# Patient Record
Sex: Male | Born: 1950 | Race: Black or African American | Hispanic: No | Smoking: Current every day smoker
Health system: Southern US, Community
[De-identification: ages and names within clinical notes are randomized; demographics above are authoritative.]

## PROBLEM LIST (undated history)

## (undated) DIAGNOSIS — E119 Type 2 diabetes mellitus without complications: Secondary | ICD-10-CM

## (undated) DIAGNOSIS — N200 Calculus of kidney: Secondary | ICD-10-CM

## (undated) DIAGNOSIS — K5710 Diverticulosis of small intestine without perforation or abscess without bleeding: Secondary | ICD-10-CM

## (undated) DIAGNOSIS — M1990 Unspecified osteoarthritis, unspecified site: Secondary | ICD-10-CM

## (undated) DIAGNOSIS — I639 Cerebral infarction, unspecified: Secondary | ICD-10-CM

## (undated) DIAGNOSIS — N400 Enlarged prostate without lower urinary tract symptoms: Secondary | ICD-10-CM

## (undated) DIAGNOSIS — E739 Lactose intolerance, unspecified: Secondary | ICD-10-CM

## (undated) DIAGNOSIS — I10 Essential (primary) hypertension: Secondary | ICD-10-CM

## (undated) DIAGNOSIS — J189 Pneumonia, unspecified organism: Secondary | ICD-10-CM

## (undated) DIAGNOSIS — M545 Low back pain, unspecified: Secondary | ICD-10-CM

## (undated) DIAGNOSIS — B1920 Unspecified viral hepatitis C without hepatic coma: Secondary | ICD-10-CM

## (undated) DIAGNOSIS — G8929 Other chronic pain: Secondary | ICD-10-CM

## (undated) HISTORY — PX: COLONOSCOPY: SHX174

## (undated) HISTORY — PX: TESTICLE REMOVAL: SHX68

## (undated) HISTORY — PX: VARIOCOCELE REPAIR: SHX242

## (undated) HISTORY — PX: TEAR DUCT PROBING: SHX793

## (undated) HISTORY — PX: PENILE PROSTHESIS IMPLANT: SHX240

## (undated) HISTORY — PX: EYE SURGERY: SHX253

---

## 2015-10-22 ENCOUNTER — Encounter (HOSPITAL_COMMUNITY): Payer: Self-pay | Admitting: Radiology

## 2015-10-22 ENCOUNTER — Observation Stay (HOSPITAL_COMMUNITY)
Admission: EM | Admit: 2015-10-22 | Discharge: 2015-10-24 | Disposition: A | Discharge status: Home / Self Care | Payer: Non-veteran care | Attending: Internal Medicine | Admitting: Internal Medicine

## 2015-10-22 ENCOUNTER — Emergency Department (HOSPITAL_COMMUNITY): Payer: Non-veteran care

## 2015-10-22 ENCOUNTER — Observation Stay (HOSPITAL_COMMUNITY): Payer: Non-veteran care

## 2015-10-22 DIAGNOSIS — G8929 Other chronic pain: Secondary | ICD-10-CM | POA: Diagnosis not present

## 2015-10-22 DIAGNOSIS — G459 Transient cerebral ischemic attack, unspecified: Secondary | ICD-10-CM | POA: Diagnosis not present

## 2015-10-22 DIAGNOSIS — R29810 Facial weakness: Secondary | ICD-10-CM | POA: Diagnosis not present

## 2015-10-22 DIAGNOSIS — I63411 Cerebral infarction due to embolism of right middle cerebral artery: Secondary | ICD-10-CM

## 2015-10-22 DIAGNOSIS — N183 Chronic kidney disease, stage 3 (moderate): Secondary | ICD-10-CM | POA: Insufficient documentation

## 2015-10-22 DIAGNOSIS — F17210 Nicotine dependence, cigarettes, uncomplicated: Secondary | ICD-10-CM | POA: Diagnosis not present

## 2015-10-22 DIAGNOSIS — E11649 Type 2 diabetes mellitus with hypoglycemia without coma: Secondary | ICD-10-CM | POA: Diagnosis not present

## 2015-10-22 DIAGNOSIS — I10 Essential (primary) hypertension: Secondary | ICD-10-CM | POA: Diagnosis not present

## 2015-10-22 DIAGNOSIS — Z7982 Long term (current) use of aspirin: Secondary | ICD-10-CM | POA: Insufficient documentation

## 2015-10-22 DIAGNOSIS — E119 Type 2 diabetes mellitus without complications: Secondary | ICD-10-CM

## 2015-10-22 DIAGNOSIS — E1122 Type 2 diabetes mellitus with diabetic chronic kidney disease: Secondary | ICD-10-CM | POA: Diagnosis not present

## 2015-10-22 DIAGNOSIS — I63232 Cerebral infarction due to unspecified occlusion or stenosis of left carotid arteries: Secondary | ICD-10-CM

## 2015-10-22 DIAGNOSIS — I6320 Cerebral infarction due to unspecified occlusion or stenosis of unspecified precerebral arteries: Secondary | ICD-10-CM

## 2015-10-22 DIAGNOSIS — I129 Hypertensive chronic kidney disease with stage 1 through stage 4 chronic kidney disease, or unspecified chronic kidney disease: Secondary | ICD-10-CM | POA: Diagnosis not present

## 2015-10-22 DIAGNOSIS — I639 Cerebral infarction, unspecified: Secondary | ICD-10-CM | POA: Diagnosis not present

## 2015-10-22 DIAGNOSIS — R531 Weakness: Secondary | ICD-10-CM

## 2015-10-22 DIAGNOSIS — IMO0002 Reserved for concepts with insufficient information to code with codable children: Secondary | ICD-10-CM

## 2015-10-22 DIAGNOSIS — Z79899 Other long term (current) drug therapy: Secondary | ICD-10-CM | POA: Insufficient documentation

## 2015-10-22 DIAGNOSIS — I6523 Occlusion and stenosis of bilateral carotid arteries: Secondary | ICD-10-CM | POA: Insufficient documentation

## 2015-10-22 DIAGNOSIS — M545 Low back pain: Secondary | ICD-10-CM | POA: Insufficient documentation

## 2015-10-22 DIAGNOSIS — N289 Disorder of kidney and ureter, unspecified: Secondary | ICD-10-CM | POA: Diagnosis not present

## 2015-10-22 DIAGNOSIS — Z794 Long term (current) use of insulin: Secondary | ICD-10-CM | POA: Insufficient documentation

## 2015-10-22 DIAGNOSIS — I6329 Cerebral infarction due to unspecified occlusion or stenosis of other precerebral arteries: Secondary | ICD-10-CM

## 2015-10-22 DIAGNOSIS — I6359 Cerebral infarction due to unspecified occlusion or stenosis of other cerebral artery: Secondary | ICD-10-CM

## 2015-10-22 DIAGNOSIS — Z8673 Personal history of transient ischemic attack (TIA), and cerebral infarction without residual deficits: Secondary | ICD-10-CM | POA: Diagnosis not present

## 2015-10-22 DIAGNOSIS — F17200 Nicotine dependence, unspecified, uncomplicated: Secondary | ICD-10-CM | POA: Diagnosis not present

## 2015-10-22 HISTORY — DX: Unspecified viral hepatitis C without hepatic coma: B19.20

## 2015-10-22 HISTORY — DX: Essential (primary) hypertension: I10

## 2015-10-22 HISTORY — DX: Diverticulosis of small intestine without perforation or abscess without bleeding: K57.10

## 2015-10-22 HISTORY — DX: Low back pain, unspecified: M54.50

## 2015-10-22 HISTORY — DX: Other chronic pain: G89.29

## 2015-10-22 HISTORY — DX: Low back pain: M54.5

## 2015-10-22 HISTORY — DX: Type 2 diabetes mellitus without complications: E11.9

## 2015-10-22 HISTORY — DX: Cerebral infarction, unspecified: I63.9

## 2015-10-22 HISTORY — DX: Unspecified osteoarthritis, unspecified site: M19.90

## 2015-10-22 LAB — COMPREHENSIVE METABOLIC PANEL
ALBUMIN: 3.2 g/dL — AB (ref 3.5–5.0)
ALT: 10 U/L — ABNORMAL LOW (ref 17–63)
AST: 16 U/L (ref 15–41)
Alkaline Phosphatase: 81 U/L (ref 38–126)
Anion gap: 10 (ref 5–15)
BUN: 16 mg/dL (ref 6–20)
CHLORIDE: 106 mmol/L (ref 101–111)
CO2: 21 mmol/L — ABNORMAL LOW (ref 22–32)
Calcium: 8.9 mg/dL (ref 8.9–10.3)
Creatinine, Ser: 1.51 mg/dL — ABNORMAL HIGH (ref 0.61–1.24)
GFR calc Af Amer: 55 mL/min — ABNORMAL LOW (ref >60)
GFR calc non Af Amer: 47 mL/min — ABNORMAL LOW (ref >60)
GLUCOSE: 75 mg/dL (ref 65–99)
POTASSIUM: 3.7 mmol/L (ref 3.5–5.1)
SODIUM: 137 mmol/L (ref 135–145)
Total Bilirubin: 0.5 mg/dL (ref 0.3–1.2)
Total Protein: 7.2 g/dL (ref 6.5–8.1)

## 2015-10-22 LAB — URINALYSIS, ROUTINE W REFLEX MICROSCOPIC
Bilirubin Urine: NEGATIVE
GLUCOSE, UA: NEGATIVE mg/dL
Ketones, ur: NEGATIVE mg/dL
Leukocytes, UA: NEGATIVE
Nitrite: NEGATIVE
Specific Gravity, Urine: 1.022 (ref 1.005–1.030)
pH: 5.5 (ref 5.0–8.0)

## 2015-10-22 LAB — I-STAT CHEM 8, ED
BUN: 18 mg/dL (ref 6–20)
CREATININE: 1.5 mg/dL — AB (ref 0.61–1.24)
Calcium, Ion: 1.08 mmol/L — ABNORMAL LOW (ref 1.13–1.30)
Chloride: 105 mmol/L (ref 101–111)
GLUCOSE: 71 mg/dL (ref 65–99)
HCT: 46 % (ref 39.0–52.0)
HEMOGLOBIN: 15.6 g/dL (ref 13.0–17.0)
POTASSIUM: 3.6 mmol/L (ref 3.5–5.1)
Sodium: 139 mmol/L (ref 135–145)
TCO2: 21 mmol/L (ref 0–100)

## 2015-10-22 LAB — CBC
HCT: 41.1 % (ref 39.0–52.0)
HEMOGLOBIN: 13.8 g/dL (ref 13.0–17.0)
MCH: 26.5 pg (ref 26.0–34.0)
MCHC: 33.6 g/dL (ref 30.0–36.0)
MCV: 78.9 fL (ref 78.0–100.0)
Platelets: 218 10*3/uL (ref 150–400)
RBC: 5.21 MIL/uL (ref 4.22–5.81)
RDW: 14.5 % (ref 11.5–15.5)
WBC: 14.1 10*3/uL — ABNORMAL HIGH (ref 4.0–10.5)

## 2015-10-22 LAB — DIFFERENTIAL
BASOS ABS: 0 10*3/uL (ref 0.0–0.1)
BASOS PCT: 0 %
EOS ABS: 0 10*3/uL (ref 0.0–0.7)
Eosinophils Relative: 0 %
Lymphocytes Relative: 18 %
Lymphs Abs: 2.6 10*3/uL (ref 0.7–4.0)
Monocytes Absolute: 1.4 10*3/uL — ABNORMAL HIGH (ref 0.1–1.0)
Monocytes Relative: 10 %
NEUTROS PCT: 72 %
Neutro Abs: 10.1 10*3/uL — ABNORMAL HIGH (ref 1.7–7.7)

## 2015-10-22 LAB — ETHANOL

## 2015-10-22 LAB — GLUCOSE, CAPILLARY
GLUCOSE-CAPILLARY: 78 mg/dL (ref 65–99)
GLUCOSE-CAPILLARY: 53 mg/dL — AB (ref 65–99)
GLUCOSE-CAPILLARY: 147 mg/dL — AB (ref 65–99)
Glucose-Capillary: 52 mg/dL — ABNORMAL LOW (ref 65–99)
Glucose-Capillary: 84 mg/dL (ref 65–99)

## 2015-10-22 LAB — RAPID URINE DRUG SCREEN, HOSP PERFORMED
Amphetamines: NOT DETECTED
BARBITURATES: NOT DETECTED
BENZODIAZEPINES: NOT DETECTED
COCAINE: NOT DETECTED
Opiates: NOT DETECTED
TETRAHYDROCANNABINOL: NOT DETECTED

## 2015-10-22 LAB — URINE MICROSCOPIC-ADD ON

## 2015-10-22 LAB — PROTIME-INR
INR: 1.09 (ref 0.00–1.49)
Prothrombin Time: 14.3 seconds (ref 11.6–15.2)

## 2015-10-22 LAB — I-STAT TROPONIN, ED: TROPONIN I, POC: 0.01 ng/mL (ref 0.00–0.08)

## 2015-10-22 LAB — CBG MONITORING, ED: Glucose-Capillary: 71 mg/dL (ref 65–99)

## 2015-10-22 LAB — APTT: APTT: 35 s (ref 24–37)

## 2015-10-22 MED ORDER — INSULIN GLARGINE 100 UNIT/ML ~~LOC~~ SOLN
20.0000 [IU] | Freq: Every day | SUBCUTANEOUS | Status: DC
  Administered 2015-10-22 – 2015-10-23 (×2): 20 [IU] via SUBCUTANEOUS
  Filled 2015-10-22 (×3): qty 0.2

## 2015-10-22 MED ORDER — DEXTROSE 50 % IV SOLN
INTRAVENOUS | Status: AC
  Filled 2015-10-22: qty 50

## 2015-10-22 MED ORDER — CLOPIDOGREL BISULFATE 75 MG PO TABS
75.0000 mg | ORAL_TABLET | Freq: Every day | ORAL | Status: DC
  Administered 2015-10-22 – 2015-10-24 (×3): 75 mg via ORAL
  Filled 2015-10-22 (×3): qty 1

## 2015-10-22 MED ORDER — INSULIN ASPART 100 UNIT/ML ~~LOC~~ SOLN: 0.0000 [IU] | Freq: Every day | SUBCUTANEOUS | Status: DC

## 2015-10-22 MED ORDER — LABETALOL HCL 5 MG/ML IV SOLN: 5.0000 mg | INTRAVENOUS | Status: DC | PRN

## 2015-10-22 MED ORDER — HEPARIN SOD (PORK) LOCK FLUSH 100 UNIT/ML IV SOLN
INTRAVENOUS | Status: AC
  Filled 2015-10-22: qty 20

## 2015-10-22 MED ORDER — DEXTROSE-NACL 5-0.9 % IV SOLN
INTRAVENOUS | Status: DC
  Administered 2015-10-22: 20:00:00 via INTRAVENOUS
  Filled 2015-10-22 (×3): qty 1000

## 2015-10-22 MED ORDER — MIDAZOLAM HCL 2 MG/2ML IJ SOLN
INTRAMUSCULAR | Status: AC | PRN
  Administered 2015-10-22: 1 mg via INTRAVENOUS

## 2015-10-22 MED ORDER — HYDRALAZINE HCL 20 MG/ML IJ SOLN
INTRAMUSCULAR | Status: AC
  Filled 2015-10-22: qty 1

## 2015-10-22 MED ORDER — DEXTROSE 50 % IV SOLN
INTRAVENOUS | Status: AC | PRN
  Administered 2015-10-22: 12.5 g via INTRAVENOUS

## 2015-10-22 MED ORDER — HEPARIN SODIUM (PORCINE) 1000 UNIT/ML IJ SOLN
INTRAMUSCULAR | Status: AC | PRN
  Administered 2015-10-22: 1000 [IU] via INTRAVENOUS

## 2015-10-22 MED ORDER — HYDRALAZINE HCL 20 MG/ML IJ SOLN
5.0000 mg | Freq: Once | INTRAMUSCULAR | Status: AC
  Administered 2015-10-22: 5 mg via INTRAVENOUS

## 2015-10-22 MED ORDER — SODIUM CHLORIDE 0.9 % IV SOLN: INTRAVENOUS | Status: DC

## 2015-10-22 MED ORDER — IOPAMIDOL (ISOVUE-300) INJECTION 61%
INTRAVENOUS | Status: AC
  Administered 2015-10-22: 20 mL
  Filled 2015-10-22: qty 50

## 2015-10-22 MED ORDER — IOPAMIDOL (ISOVUE-370) INJECTION 76%
INTRAVENOUS | Status: AC
  Administered 2015-10-22: 75 mL
  Filled 2015-10-22: qty 100

## 2015-10-22 MED ORDER — SODIUM CHLORIDE 0.9% FLUSH
3.0000 mL | Freq: Two times a day (BID) | INTRAVENOUS | Status: DC
  Administered 2015-10-22 – 2015-10-24 (×3): 3 mL via INTRAVENOUS

## 2015-10-22 MED ORDER — ENOXAPARIN SODIUM 40 MG/0.4ML ~~LOC~~ SOLN
40.0000 mg | SUBCUTANEOUS | Status: DC
  Administered 2015-10-22 – 2015-10-23 (×2): 40 mg via SUBCUTANEOUS
  Filled 2015-10-22 (×2): qty 0.4

## 2015-10-22 MED ORDER — LIDOCAINE HCL 1 % IJ SOLN
INTRAMUSCULAR | Status: AC
  Administered 2015-10-22: 8 mL
  Filled 2015-10-22: qty 20

## 2015-10-22 MED ORDER — INSULIN ASPART 100 UNIT/ML ~~LOC~~ SOLN
0.0000 [IU] | Freq: Three times a day (TID) | SUBCUTANEOUS | Status: DC
  Administered 2015-10-23: 2 [IU] via SUBCUTANEOUS

## 2015-10-22 MED ORDER — FENTANYL CITRATE (PF) 100 MCG/2ML IJ SOLN
INTRAMUSCULAR | Status: AC
  Filled 2015-10-22: qty 2

## 2015-10-22 MED ORDER — MIDAZOLAM HCL 2 MG/2ML IJ SOLN
INTRAMUSCULAR | Status: AC
  Filled 2015-10-22: qty 2

## 2015-10-22 MED ORDER — ASPIRIN 325 MG PO TABS
325.0000 mg | ORAL_TABLET | Freq: Every day | ORAL | Status: DC
  Administered 2015-10-23: 325 mg via ORAL
  Filled 2015-10-22: qty 1

## 2015-10-22 MED ORDER — SODIUM CHLORIDE 0.9 % IV SOLN
INTRAVENOUS | Status: AC | PRN
  Administered 2015-10-22: 250 mL via INTRAVENOUS

## 2015-10-22 MED ORDER — IOPAMIDOL (ISOVUE-300) INJECTION 61%
INTRAVENOUS | Status: AC
  Administered 2015-10-22: 80 mL via INTRA_ARTERIAL
  Filled 2015-10-22: qty 150

## 2015-10-22 MED ORDER — FENTANYL CITRATE (PF) 100 MCG/2ML IJ SOLN
INTRAMUSCULAR | Status: AC | PRN
  Administered 2015-10-22 (×2): 25 ug via INTRAVENOUS

## 2015-10-22 NOTE — ED Provider Notes (Signed)
CSN: [id number]     Arrival date & time 10/22/15  0901 History   First MD Initiated Contact with Patient 10/22/15 [phone]     Chief Complaint  Patient presents with  . Code Stroke    An emergency department physician performed an initial assessment on this suspected stroke patient at [phone]. (Consider location/radiation/quality/duration/timing/severity/associated sxs/prior Treatment) HPI   Lance Duncan is a 65 year old male with a past medical history of HTN, DM, chronic back pain who presents the ED with acute onset left-sided weakness. Patient states that he was sitting up in his chair around 4 AM watching television and noticed that his left arm began to feel numb and tingling. He was then unable to reach over to grab his remote as normal and states that he couldn't get his arm to move. Pt was also unable to walk as his left leg felt very weak. No associated slurred speech or facial drooping. Pt denies HA, blurry vision, confusion. Pt does not take blood thinners.   No past medical history on file. No past surgical history on file. No family history on file. Social History  Substance Use Topics  . Smoking status: Not on file  . Smokeless tobacco: Not on file  . Alcohol Use: Not on file    Review of Systems  All other systems reviewed and are negative.     Allergies  Review of patient's allergies indicates not on file.  Home Medications   Prior to Admission medications   Not on File   SpO2 99% Physical Exam  Constitutional: He is oriented to person, place, and time. He appears well-developed and well-nourished. No distress.  HENT:  Head: Normocephalic and atraumatic.  Mouth/Throat: No oropharyngeal exudate.  Eyes: Conjunctivae and EOM are normal. Pupils are equal, round, and reactive to light. Right eye exhibits no discharge. Left eye exhibits no discharge. No scleral icterus.  Cardiovascular: Normal rate, regular rhythm, normal heart sounds and intact distal pulses.   Exam reveals no gallop and no friction rub.   No murmur heard. Pulmonary/Chest: Effort normal and breath sounds normal. No respiratory distress. He has no wheezes. He has no rales. He exhibits no tenderness.  Abdominal: Soft. He exhibits no distension. There is no tenderness. There is no guarding.  Musculoskeletal: Normal range of motion. He exhibits no edema.  Neurological: He is alert and oriented to person, place, and time. Coordination and gait abnormal.  LLE strength 3/5. LUE strength 4/5. Significant gait ataxia. Dysmetria present. Unable to perform finger to nose with left hand. No sensory deficits. RUE and RLE strength 5/5.  No facial droop. No slurred speech. Left pronator drift present.  Skin: Skin is warm and dry. No rash noted. He is not diaphoretic. No erythema. No pallor.  Psychiatric: He has a normal mood and affect. His behavior is normal.  Nursing note and vitals reviewed.   ED Course  Procedures (including critical care time) Labs Review Labs Reviewed  CBC - Abnormal; Notable for the following:    WBC 14.1 (*)    All other components within normal limits  DIFFERENTIAL - Abnormal; Notable for the following:    Neutro Abs 10.1 (*)    Monocytes Absolute 1.4 (*)    All other components within normal limits  COMPREHENSIVE METABOLIC PANEL - Abnormal; Notable for the following:    CO2 21 (*)    Creatinine, Ser 1.51 (*)    Albumin 3.2 (*)    ALT 10 (*)    GFR calc non Af  Amer 47 (*)    GFR calc Af Amer 55 (*)    All other components within normal limits  I-STAT CHEM 8, ED - Abnormal; Notable for the following:    Creatinine, Ser 1.50 (*)    Calcium, Ion 1.08 (*)    All other components within normal limits  ETHANOL  PROTIME-INR  APTT  URINE RAPID DRUG SCREEN, HOSP PERFORMED  URINALYSIS, ROUTINE W REFLEX MICROSCOPIC (NOT AT [facility])  I-STAT TROPOININ, ED  CBG MONITORING, ED    Imaging Review Ct Angio Head W/cm &/or Wo Cm  10/22/2015  EXAM: CT ANGIOGRAPHY HEAD AND  NECK TECHNIQUE: Multidetector CT imaging of the head and neck was performed using the standard protocol during bolus administration of intravenous contrast. Multiplanar CT image reconstructions and MIPs were obtained to evaluate the vascular anatomy. Carotid stenosis measurements (when applicable) are obtained utilizing NASCET criteria, using the distal internal carotid diameter as the denominator. COMPARISON:  None. FINDINGS: CT HEAD Brain: No intracranial hemorrhage or CT evidence of large acute infarct. Small vessel disease changes. Limited for excluding small acute infarct given white matter changes. MR can be obtained for further delineation if clinically desired. No worrisome intracranial enhancing lesion. Global atrophy without hydrocephalus. Calvarium and skull base: Arachnoid granulations without destructive lesion. Paranasal sinuses: Dental disease with periapical lucency upper teeth without adjacent sinus disease. Mastoid air cells and middle ear cavities are clear. Orbits: Minimal exophthalmos. CTA NECK Aortic arch: Common origin innominate and left common carotid artery. Plaque aortic arch and origin of great vessels with mild slightly moderate narrowing proximal left subclavian artery. Right carotid system: Calcified plaque carotid bifurcation with 48% diameter stenosis distal right common carotid artery. Calcified plaque proximal right internal carotid artery with less than 50% diameter stenosis. 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 This is flow limiting as the left internal carotid artery beyond this region is narrowed. Vertebral arteries:Left vertebral artery is slightly dominant size. Mild irregularity of the cervical segment of the vertebral arteries bilaterally. Skeleton: Dental disease. 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 Other neck: Goiter with  substernal extension on the left. Mild asymmetry palatine tonsils/ piriform sinus without mass identified. Upper chest: Chronic lung changes possibly with superimposed pulmonary vascular prominence. CTA HEAD Anterior circulation: No medium or large size vessel significant stenosis or occlusion. Middle cerebral artery branch vessel narrowing irregularity bilaterally. Plaque internal carotid artery cavernous segment without high-grade stenosis. There may be 2 tiny (1 mm) aneurysm of the right internal carotid artery cavernous segment. Fetal type contribution to the posterior cerebral arteries. Posterior circulation: Narrowed irregular distal vertebral arteries and basilar artery. Irregular narrowed posterior inferior cerebellar arteries, anterior inferior cerebral arteries and superior cerebellar arteries. Venous sinuses: Patent. Anatomic variants: As above. Delayed phase: As above. IMPRESSION: CT HEAD No intracranial hemorrhage or CT evidence of large acute infarct. Small vessel disease changes. Global atrophy without hydrocephalus. CTA NECK Mild to slightly moderate narrowing proximal left subclavian artery. Calcified plaque right carotid bifurcation with 48% diameter stenosis distal right common carotid artery. Calcified plaque proximal right internal carotid artery with less than 50% diameter stenosis. 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 This is flow limiting as the left internal carotid artery beyond this region is narrowed. Left vertebral artery is slightly dominant size. Mild irregularity of the cervical segment of the vertebral arteries bilaterally. Dental  disease. 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 Chronic lung changes possibly with superimposed pulmonary vascular prominence. CTA HEAD Anterior circulation without medium or large size vessel  significant stenosis or occlusion. Middle cerebral artery branch vessel narrowing and irregularity bilaterally. Plaque internal carotid artery cavernous segment without high-grade stenosis. There may be 2 tiny (1 mm) aneurysms of the right internal carotid artery cavernous segment. Fetal type contribution to the posterior cerebral arteries. Irregular narrowed distal vertebral arteries and basilar artery. Irregular narrowed posterior inferior cerebellar arteries, anterior inferior cerebral arteries and superior cerebellar arteries. These results were called by telephone at the time of interpretation on 10/22/2015 at 9:42 am to Dr. Lavon Paganini who verbally acknowledged these results. Electronically Signed   By: Lacy Duverney M.D.   On: 10/22/2015 10:22   Ct Head Wo Contrast  10/22/2015  CLINICAL DATA:  Code stroke.  New onset left-sided weakness. EXAM: CT HEAD WITHOUT CONTRAST TECHNIQUE: Contiguous axial images were obtained from the base of the skull through the vertex without intravenous contrast. COMPARISON:  None. FINDINGS: No acute infarct, hemorrhage, or mass lesion is present. Mild generalized atrophy and scattered subcortical white matter hypoattenuation is advanced for age. The cortex is intact. The basal ganglia are intact. The insular ribbon is within normal limits. The brainstem and cerebellum are unremarkable. No significant extra-axial fluid collection is present. Atherosclerotic changes are present within the cavernous internal carotid arteries bilaterally. The paranasal sinuses and mastoid air cells are clear. The calvarium is intact. No significant extracranial soft tissue lesions are present. The globes and orbits are intact. ASPECTS score = 10/10 [place] Stroke Program Early CT Score Normal score = 10 IMPRESSION: 1. No acute intracranial abnormality. 2. Mild generalized atrophy and white matter disease is slightly advanced for age. This likely reflects the sequela of chronic microvascular ischemia.  These results were called by telephone at the time of interpretation on 10/22/2015 at 9:22 am to Dr. Lavon Paganini , who verbally acknowledged these results. Electronically Signed   By: Marin Roberts M.D.   On: 10/22/2015 09:24   Ct Angio Neck W/cm &/or Wo/cm  10/22/2015  EXAM: CT ANGIOGRAPHY HEAD AND NECK TECHNIQUE: Multidetector CT imaging of the head and neck was performed using the standard protocol during bolus administration of intravenous contrast. Multiplanar CT image reconstructions and MIPs were obtained to evaluate the vascular anatomy. Carotid stenosis measurements (when applicable) are obtained utilizing NASCET criteria, using the distal internal carotid diameter as the denominator. COMPARISON:  None. FINDINGS: CT HEAD Brain: No intracranial hemorrhage or CT evidence of large acute infarct. Small vessel disease changes. Limited for excluding small acute infarct given white matter changes. MR can be obtained for further delineation if clinically desired. No worrisome intracranial enhancing lesion. Global atrophy without hydrocephalus. Calvarium and skull base: Arachnoid granulations without destructive lesion. Paranasal sinuses: Dental disease with periapical lucency upper teeth without adjacent sinus disease. Mastoid air cells and middle ear cavities are clear. Orbits: Minimal exophthalmos. CTA NECK Aortic arch: Common origin innominate and left common carotid artery. Plaque aortic arch and origin of great vessels with mild slightly moderate narrowing proximal left subclavian artery. Right carotid system: Calcified plaque carotid bifurcation with 48% diameter stenosis distal right common carotid artery. Calcified plaque proximal right internal carotid artery with less than 50% diameter stenosis. 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 This is flow limiting as the left  internal carotid artery  beyond this region is narrowed. Vertebral arteries:Left vertebral artery is slightly dominant size. Mild irregularity of the cervical segment of the vertebral arteries bilaterally. Skeleton: Dental disease. 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 Mild asymmetry palatine tonsils/ piriform sinus without mass identified. Upper chest: Chronic lung changes possibly with superimposed pulmonary vascular prominence. CTA HEAD Anterior circulation: No medium or large size vessel significant stenosis or occlusion. Middle cerebral artery branch vessel narrowing irregularity bilaterally. Plaque internal carotid artery cavernous segment without high-grade stenosis. There may be 2 tiny (1 mm) aneurysm of the right internal carotid artery cavernous segment. Fetal type contribution to the posterior cerebral arteries. Posterior circulation: Narrowed irregular distal vertebral arteries and basilar artery. Irregular narrowed posterior inferior cerebellar arteries, anterior inferior cerebral arteries and superior cerebellar arteries. Venous sinuses: Patent. Anatomic variants: As above. Delayed phase: As above. IMPRESSION: CT HEAD No intracranial hemorrhage or CT evidence of large acute infarct. Small vessel disease changes. Global atrophy without hydrocephalus. CTA NECK Mild to slightly moderate narrowing proximal left subclavian artery. Calcified plaque right carotid bifurcation with 48% diameter stenosis distal right common carotid artery. Calcified plaque proximal right internal carotid artery with less than 50% diameter stenosis. 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 This is flow limiting as the left internal carotid artery beyond this region is narrowed. Left vertebral artery is slightly dominant size. Mild irregularity of the cervical segment of the vertebral  arteries bilaterally. Dental disease. 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 Chronic lung changes possibly with superimposed pulmonary vascular prominence. CTA HEAD Anterior circulation without medium or large size vessel significant stenosis or occlusion. Middle cerebral artery branch vessel narrowing and irregularity bilaterally. Plaque internal carotid artery cavernous segment without high-grade stenosis. There may be 2 tiny (1 mm) aneurysms of the right internal carotid artery cavernous segment. Fetal type contribution to the posterior cerebral arteries. Irregular narrowed distal vertebral arteries and basilar artery. Irregular narrowed posterior inferior cerebellar arteries, anterior inferior cerebral arteries and superior cerebellar arteries. These results were called by telephone at the time of interpretation on 10/22/2015 at 9:42 am to Dr. Lavon PaganiniNandigam who verbally acknowledged these results. Electronically Signed   By: Lacy DuverneySteven  Olson M.D.   On: 10/22/2015 10:22   I have personally reviewed and evaluated these images and lab results as part of my medical decision-making.   EKG Interpretation None      MDM   Final diagnoses:  Weakness due to cerebrovascular accident    65 y.o m presents with new onset left sided weakness. Last known normal was 5 hours PTA. Left sided neurological deficits present on exam. Code stroke was called. CT head negative for acute infarct or bleed. 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 Neurology, Dr. Lavon PaganiniNandigam at bedside. Plan to admit for stroke workup. No tPA indicated at this time. Creatinine elevated at 1.5. Unknown baseline. Pt is currently hemodynamically stable and awaiting bed placement. Spoke with hospitalist who will accept pt to their service.   Patient was  discussed with and seen by Dr. Fayrene FearingJames who agrees with the treatment plan.      Lester KinsmanSamantha Tripp [place], PA-C 10/22/15 1045  Rolland PorterMark James, MD 10/30/15 [phone]

## 2015-10-22 NOTE — Progress Notes (Signed)
RN CINDY NOTIFIED THAT PT STATED THAT HE HAD A PENILE IMPLANT AND THAT PT HAS NO INFORMATION ON IT, NEED INFO ON IMPLANT IN ORDER TO GET SCAN DONE, CLARIFIED WITH TECH CHARLA

## 2015-10-22 NOTE — ED Provider Notes (Signed)
Pt seen by myself on arrival just inside our ambulance bay on arrival at 9:15.  He presents with complaint of numbness or weakness of the left upper, and lower extremity. Last time known normal was 3-4am AM this morning. He was up with insomnia due to chronic back pain. He remembers being able to use his left upper extremity or his water, and his remote control. These are sitting at his left chair side table. By 5 AM he felt like the arm was numb and it became weak and difficult to use.  On exam here he does not have facial deficits. He has 4 out of 5 weakness and dysmetria with the left upper extremity, 4 out of 5 weakness left lower extremity area initial CT, CTA showed no acute abnormalities. Patient will require admission for further stroke workup. He is in a sinus rhythm, hemolytically stable, normal blood sugar.  Rolland PorterMark Idy Rawling, MD 10/22/15 1021

## 2015-10-22 NOTE — Progress Notes (Signed)
Patient had a bloodsugar of 44 patient was given 4oz of orange juice was given with sugar

## 2015-10-22 NOTE — Sedation Documentation (Signed)
Patient is resting comfortably. 

## 2015-10-22 NOTE — Procedures (Signed)
S/P 4 vessel cerebral arteriogram. RT CFa approach. Findings. 1.95 % plus pre occlusive stenosis of LT ICA prox. 2.Non opacification of LT ACA prox. 3.Approx 50 % stenosis of Rt ICA petrous  Cavernous  segment. 4.approx 50 % stenosis of LT VA  prox

## 2015-10-22 NOTE — Consult Note (Signed)
Chief Complaint: CVA  Referring Physician: Dr. Alita Chyle  Supervising Physician: Luanne Bras  HPI: Lance Duncan is an 65 y.o. male who woke up this morning around 3 or 4 am due to chronic back pain.  The next time he was awake he was having some LUE and LLE weakness.  He denies blurred vision or slurred speech.  He presented to the [facility] where he had a CTA of the head and neck that showed some small vessel disease along with some narrowing and stenosis.  We have been asked to see the patient for a cerebral angiogram to further delineate these findings.  Past Medical History:  Past Medical History  Diagnosis Date  . Hypertension   . Diverticula of small intestine   . Stroke [facility])     Past Surgical History:  Past Surgical History  Procedure Laterality Date  . Penile prosthesis implant      10/22/15 PT STATED WHILE IN MRI  . Abdominal surgery      Family History: History reviewed. No pertinent family history.  Social History:  reports that he has been smoking Cigarettes.  He does not have any smokeless tobacco history on file. He reports that he drinks alcohol. His drug history is not on file.  Allergies: No Known Allergies  Medications:   Medication List    ASK your doctor about these medications        amLODipine 10 MG tablet  Commonly known as:  NORVASC  Take 10 mg by mouth daily.     dorzolamide-timolol 22.3-6.8 MG/ML ophthalmic solution  Commonly known as:  COSOPT  Place 1 drop into both eyes 2 (two) times daily.     hydrochlorothiazide 25 MG tablet  Commonly known as:  HYDRODIURIL  Take 25 mg by mouth daily.     insulin glargine 100 unit/mL Sopn  Commonly known as:  LANTUS  Inject 66-74 Units into the skin at bedtime. Take 66 units before the first meal, and 74 units at bedtime     latanoprost 0.005 % ophthalmic solution  Commonly known as:  XALATAN  Place 1 drop into both eyes at bedtime.     lisinopril 40 MG tablet  Commonly known as:   PRINIVIL,ZESTRIL  Take 40 mg by mouth daily.     traMADol 50 MG tablet  Commonly known as:  ULTRAM  Take 50-100 mg by mouth every 6 (six) hours as needed for severe pain.        Please HPI for pertinent positives, otherwise complete 10 system ROS negative.  Mallampati Score: MD Evaluation Airway: WNL Heart: WNL Abdomen: WNL Chest/ Lungs: WNL ASA  Classification: 3 Mallampati/Airway Score: Two  Physical Exam: BP 188/100 mmHg  Pulse 58  Temp(Src) 98.7 F (37.1 C)  Resp 22  SpO2 98% There is no height or weight on file to calculate BMI. General: pleasant, WD, WN black male who is laying in bed in NAD HEENT: head is normocephalic, atraumatic.  Sclera are noninjected.  PERRL.  Ears and nose without any masses or lesions.  Mouth is pink and moist Heart: regular, rate, and rhythm.  Normal s1,s2. No obvious murmurs, gallops, or rubs noted.  Palpable radial and pedal pulses bilaterally Lungs: CTAB, no wheezes, rhonchi, or rales noted.  Respiratory effort nonlabored Abd: soft, NT, ND, +BS, no masses, hernias, or organomegaly Neuro: cranial nerves are Grossly intact.  Equal strength in upper and lower extremities bilaterally.  Normal sensation throughout.   Psych: A&Ox3 with an appropriate  affect.   Labs: Results for orders placed or performed during the hospital encounter of 10/22/15 (from the past 48 hour(s))  CBG monitoring, ED     Status: None   Collection Time: 10/22/15  9:04 AM  Result Value Ref Range   Glucose-Capillary 71 65 - 99 mg/dL  Protime-INR     Status: None   Collection Time: 10/22/15  9:11 AM  Result Value Ref Range   Prothrombin Time 14.3 11.6 - 15.2 seconds   INR 1.09 0.00 - 1.49  APTT     Status: None   Collection Time: 10/22/15  9:11 AM  Result Value Ref Range   aPTT 35 24 - 37 seconds  CBC     Status: Abnormal   Collection Time: 10/22/15  9:11 AM  Result Value Ref Range   WBC 14.1 (H) 4.0 - 10.5 K/uL   RBC 5.21 4.22 - 5.81 MIL/uL   Hemoglobin 13.8  13.0 - 17.0 g/dL   HCT 41.1 39.0 - 52.0 %   MCV 78.9 78.0 - 100.0 fL   MCH 26.5 26.0 - 34.0 pg   MCHC 33.6 30.0 - 36.0 g/dL   RDW 14.5 11.5 - 15.5 %   Platelets 218 150 - 400 K/uL  Differential     Status: Abnormal   Collection Time: 10/22/15  9:11 AM  Result Value Ref Range   Neutrophils Relative % 72 %   Neutro Abs 10.1 (H) 1.7 - 7.7 K/uL   Lymphocytes Relative 18 %   Lymphs Abs 2.6 0.7 - 4.0 K/uL   Monocytes Relative 10 %   Monocytes Absolute 1.4 (H) 0.1 - 1.0 K/uL   Eosinophils Relative 0 %   Eosinophils Absolute 0.0 0.0 - 0.7 K/uL   Basophils Relative 0 %   Basophils Absolute 0.0 0.0 - 0.1 K/uL  Comprehensive metabolic panel     Status: Abnormal   Collection Time: 10/22/15  9:11 AM  Result Value Ref Range   Sodium 137 135 - 145 mmol/L   Potassium 3.7 3.5 - 5.1 mmol/L   Chloride 106 101 - 111 mmol/L   CO2 21 (L) 22 - 32 mmol/L   Glucose, Bld 75 65 - 99 mg/dL   BUN 16 6 - 20 mg/dL   Creatinine, Ser 1.51 (H) 0.61 - 1.24 mg/dL   Calcium 8.9 8.9 - 10.3 mg/dL   Total Protein 7.2 6.5 - 8.1 g/dL   Albumin 3.2 (L) 3.5 - 5.0 g/dL   AST 16 15 - 41 U/L   ALT 10 (L) 17 - 63 U/L   Alkaline Phosphatase 81 38 - 126 U/L   Total Bilirubin 0.5 0.3 - 1.2 mg/dL   GFR calc non Af Amer 47 (L) >60 mL/min   GFR calc Af Amer 55 (L) >60 mL/min    Comment: (NOTE) The eGFR has been calculated using the CKD EPI equation. This calculation has not been validated in all clinical situations. eGFR's persistently <60 mL/min signify possible Chronic Kidney Disease.    Anion gap 10 5 - 15  I-stat troponin, ED (not at [facility])     Status: None   Collection Time: 10/22/15  9:11 AM  Result Value Ref Range   Troponin i, poc 0.01 0.00 - 0.08 ng/mL   Comment 3            Comment: Due to the release kinetics of cTnI, a negative result within the first hours of the onset of symptoms does not rule out myocardial infarction with certainty.  If myocardial infarction is still suspected, repeat the test  at appropriate intervals.   Ethanol     Status: None   Collection Time: 10/22/15  9:12 AM  Result Value Ref Range   Alcohol, Ethyl (B) <5 <5 mg/dL    Comment:        LOWEST DETECTABLE LIMIT FOR SERUM ALCOHOL IS 5 mg/dL FOR MEDICAL PURPOSES ONLY REPEATED TO VERIFY   I-Stat Chem 8, ED  (not at [facility])     Status: Abnormal   Collection Time: 10/22/15  9:13 AM  Result Value Ref Range   Sodium 139 135 - 145 mmol/L   Potassium 3.6 3.5 - 5.1 mmol/L   Chloride 105 101 - 111 mmol/L   BUN 18 6 - 20 mg/dL   Creatinine, Ser 1.50 (H) 0.61 - 1.24 mg/dL   Glucose, Bld 71 65 - 99 mg/dL   Calcium, Ion 1.08 (L) 1.13 - 1.30 mmol/L   TCO2 21 0 - 100 mmol/L   Hemoglobin 15.6 13.0 - 17.0 g/dL   HCT 46.0 39.0 - 52.0 %  Urinalysis, Routine w reflex microscopic (not at [facility])     Status: Abnormal   Collection Time: 10/22/15 10:40 AM  Result Value Ref Range   Color, Urine YELLOW YELLOW   APPearance CLEAR CLEAR   Specific Gravity, Urine 1.022 1.005 - 1.030   pH 5.5 5.0 - 8.0   Glucose, UA NEGATIVE NEGATIVE mg/dL   Hgb urine dipstick MODERATE (A) NEGATIVE   Bilirubin Urine NEGATIVE NEGATIVE   Ketones, ur NEGATIVE NEGATIVE mg/dL   Protein, ur >300 (A) NEGATIVE mg/dL   Nitrite NEGATIVE NEGATIVE   Leukocytes, UA NEGATIVE NEGATIVE  Urine microscopic-add on     Status: Abnormal   Collection Time: 10/22/15 10:40 AM  Result Value Ref Range   Squamous Epithelial / LPF 0-5 (A) NONE SEEN   WBC, UA 0-5 0 - 5 WBC/hpf   RBC / HPF 0-5 0 - 5 RBC/hpf   Bacteria, UA RARE (A) NONE SEEN  Urine rapid drug screen (hosp performed)not at [facility]     Status: None   Collection Time: 10/22/15 11:00 AM  Result Value Ref Range   Opiates NONE DETECTED NONE DETECTED   Cocaine NONE DETECTED NONE DETECTED   Benzodiazepines NONE DETECTED NONE DETECTED   Amphetamines NONE DETECTED NONE DETECTED   Tetrahydrocannabinol NONE DETECTED NONE DETECTED   Barbiturates NONE DETECTED NONE DETECTED    Comment:        DRUG SCREEN FOR  MEDICAL PURPOSES ONLY.  IF CONFIRMATION IS NEEDED FOR ANY PURPOSE, NOTIFY LAB WITHIN 5 DAYS.        LOWEST DETECTABLE LIMITS FOR URINE DRUG SCREEN Drug Class       Cutoff (ng/mL) Amphetamine      1000 Barbiturate      200 Benzodiazepine   330 Tricyclics       076 Opiates          300 Cocaine          300 THC              50     Imaging: Ct Angio Head W/cm &/or Wo Cm  10/22/2015  EXAM: CT ANGIOGRAPHY HEAD AND NECK TECHNIQUE: Multidetector CT imaging of the head and neck was performed using the standard protocol during bolus administration of intravenous contrast. Multiplanar CT image reconstructions and MIPs were obtained to evaluate the vascular anatomy. Carotid stenosis measurements (when applicable) are obtained utilizing NASCET criteria, using the distal  internal carotid diameter as the denominator. COMPARISON:  None. FINDINGS: CT HEAD Brain: No intracranial hemorrhage or CT evidence of large acute infarct. Small vessel disease changes. Limited for excluding small acute infarct given white matter changes. MR can be obtained for further delineation if clinically desired. No worrisome intracranial enhancing lesion. Global atrophy without hydrocephalus. Calvarium and skull base: Arachnoid granulations without destructive lesion. Paranasal sinuses: Dental disease with periapical lucency upper teeth without adjacent sinus disease. Mastoid air cells and middle ear cavities are clear. Orbits: Minimal exophthalmos. CTA NECK Aortic arch: Common origin innominate and left common carotid artery. Plaque aortic arch and origin of great vessels with mild slightly moderate narrowing proximal left subclavian artery. Right carotid system: Calcified plaque carotid bifurcation with 48% diameter stenosis distal right common carotid artery. Calcified plaque proximal right internal carotid artery with less than 50% diameter stenosis. 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 This is flow limiting as the left internal carotid artery beyond this region is narrowed. Vertebral arteries:Left vertebral artery is slightly dominant size. Mild irregularity of the cervical segment of the vertebral arteries bilaterally. Skeleton: Dental disease. 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 Mild asymmetry palatine tonsils/ piriform sinus without mass identified. Upper chest: Chronic lung changes possibly with superimposed pulmonary vascular prominence. CTA HEAD Anterior circulation: No medium or large size vessel significant stenosis or occlusion. Middle cerebral artery branch vessel narrowing irregularity bilaterally. Plaque internal carotid artery cavernous segment without high-grade stenosis. There may be 2 tiny (1 mm) aneurysm of the right internal carotid artery cavernous segment. Fetal type contribution to the posterior cerebral arteries. Posterior circulation: Narrowed irregular distal vertebral arteries and basilar artery. Irregular narrowed posterior inferior cerebellar arteries, anterior inferior cerebral arteries and superior cerebellar arteries. Venous sinuses: Patent. Anatomic variants: As above. Delayed phase: As above. IMPRESSION: CT HEAD No intracranial hemorrhage or CT evidence of large acute infarct. Small vessel disease changes. Global atrophy without hydrocephalus. CTA NECK Mild to slightly moderate narrowing proximal left subclavian artery. Calcified plaque right carotid bifurcation with 48% diameter stenosis distal right common carotid artery. Calcified plaque proximal right internal carotid artery with less than 50% diameter stenosis. 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 This is flow limiting as the left internal carotid artery beyond  this region is narrowed. Left vertebral artery is slightly dominant size. Mild irregularity of the cervical segment of the vertebral arteries bilaterally. Dental disease. 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 Chronic lung changes possibly with superimposed pulmonary vascular prominence. CTA HEAD Anterior circulation without medium or large size vessel significant stenosis or occlusion. Middle cerebral artery branch vessel narrowing and irregularity bilaterally. Plaque internal carotid artery cavernous segment without high-grade stenosis. There may be 2 tiny (1 mm) aneurysms of the right internal carotid artery cavernous segment. Fetal type contribution to the posterior cerebral arteries. Irregular narrowed distal vertebral arteries and basilar artery. Irregular narrowed posterior inferior cerebellar arteries, anterior inferior cerebral arteries and superior cerebellar arteries. These results were called by telephone at the time of interpretation on 10/22/2015 at 9:42 am to Dr. Silverio Decamp who verbally acknowledged these results. Electronically Signed   By: Genia Del M.D.   On: 10/22/2015 10:22   Ct Head Wo Contrast  10/22/2015  CLINICAL DATA:  Code stroke.  New onset left-sided weakness. EXAM: CT HEAD WITHOUT CONTRAST TECHNIQUE: Contiguous axial images were obtained from the base of the skull through the vertex without intravenous contrast. COMPARISON:  None. FINDINGS: No acute infarct, hemorrhage, or mass lesion is present. Mild generalized atrophy and scattered subcortical white matter hypoattenuation is advanced for age. The cortex is intact. The basal ganglia are intact. The insular ribbon is within normal limits. The brainstem and cerebellum are unremarkable. No significant extra-axial fluid collection is present. Atherosclerotic changes are present within the cavernous internal carotid arteries bilaterally. The paranasal sinuses  and mastoid air cells are clear. The calvarium is intact. No significant extracranial soft tissue lesions are present. The globes and orbits are intact. ASPECTS score = 10/10 [place] Stroke Program Early CT Score Normal score = 10 IMPRESSION: 1. No acute intracranial abnormality. 2. Mild generalized atrophy and white matter disease is slightly advanced for age. This likely reflects the sequela of chronic microvascular ischemia. These results were called by telephone at the time of interpretation on 10/22/2015 at 9:22 am to Dr. Silverio Decamp , who verbally acknowledged these results. Electronically Signed   By: San Morelle M.D.   On: 10/22/2015 09:24   Ct Angio Neck W/cm &/or Wo/cm  10/22/2015  EXAM: CT ANGIOGRAPHY HEAD AND NECK TECHNIQUE: Multidetector CT imaging of the head and neck was performed using the standard protocol during bolus administration of intravenous contrast. Multiplanar CT image reconstructions and MIPs were obtained to evaluate the vascular anatomy. Carotid stenosis measurements (when applicable) are obtained utilizing NASCET criteria, using the distal internal carotid diameter as the denominator. COMPARISON:  None. FINDINGS: CT HEAD Brain: No intracranial hemorrhage or CT evidence of large acute infarct. Small vessel disease changes. Limited for excluding small acute infarct given white matter changes. MR can be obtained for further delineation if clinically desired. No worrisome intracranial enhancing lesion. Global atrophy without hydrocephalus. Calvarium and skull base: Arachnoid granulations without destructive lesion. Paranasal sinuses: Dental disease with periapical lucency upper teeth without adjacent sinus disease. Mastoid air cells and middle ear cavities are clear. Orbits: Minimal exophthalmos. CTA NECK Aortic arch: Common origin innominate and left common carotid artery. Plaque aortic arch and origin of great vessels with mild slightly moderate narrowing proximal left subclavian  artery. Right carotid system: Calcified plaque carotid bifurcation with 48% diameter stenosis distal right common carotid artery. Calcified plaque proximal right internal carotid artery with less than 50% diameter stenosis. 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 This is flow limiting as the left internal carotid artery beyond this region is narrowed. Vertebral arteries:Left vertebral artery is slightly dominant size. Mild irregularity of the cervical segment of the vertebral arteries bilaterally. Skeleton: Dental disease. 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 Mild asymmetry palatine tonsils/ piriform sinus without mass identified. Upper chest: Chronic lung changes possibly with superimposed pulmonary vascular prominence. CTA HEAD Anterior circulation: No medium or large size vessel significant stenosis or occlusion. Middle cerebral artery branch vessel narrowing irregularity bilaterally. Plaque internal carotid artery cavernous segment without high-grade stenosis. There may be 2 tiny (1 mm) aneurysm of the right internal carotid artery cavernous segment. Fetal type contribution to the posterior cerebral arteries. Posterior circulation: Narrowed irregular distal vertebral arteries and basilar artery. Irregular narrowed posterior inferior cerebellar arteries, anterior inferior cerebral arteries and superior cerebellar arteries. Venous sinuses: Patent. Anatomic variants: As above.  Delayed phase: As above. IMPRESSION: CT HEAD No intracranial hemorrhage or CT evidence of large acute infarct. Small vessel disease changes. Global atrophy without hydrocephalus. CTA NECK Mild to slightly moderate narrowing proximal left subclavian artery. Calcified plaque right carotid bifurcation with 48% diameter stenosis distal right common  carotid artery. Calcified plaque proximal right internal carotid artery with less than 50% diameter stenosis. 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 This is flow limiting as the left internal carotid artery beyond this region is narrowed. Left vertebral artery is slightly dominant size. Mild irregularity of the cervical segment of the vertebral arteries bilaterally. Dental disease. 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 Chronic lung changes possibly with superimposed pulmonary vascular prominence. CTA HEAD Anterior circulation without medium or large size vessel significant stenosis or occlusion. Middle cerebral artery branch vessel narrowing and irregularity bilaterally. Plaque internal carotid artery cavernous segment without high-grade stenosis. There may be 2 tiny (1 mm) aneurysms of the right internal carotid artery cavernous segment. Fetal type contribution to the posterior cerebral arteries. Irregular narrowed distal vertebral arteries and basilar artery. Irregular narrowed posterior inferior cerebellar arteries, anterior inferior cerebral arteries and superior cerebellar arteries. These results were called by telephone at the time of interpretation on 10/22/2015 at 9:42 am to Dr. Silverio Decamp who verbally acknowledged these results. Electronically Signed   By: Genia Del M.D.   On: 10/22/2015 10:22    Assessment/Plan 1. Possible CVA vs TIA -we will try to proceed with cerebral angiogram today.  His Creatinine is 1.5 and he has had one dye load already today.  Will d/w Dr. Estanislado Pandy how he would like to proceed as far as this is concerned. -other labs have been reviewed -Risks and Benefits discussed with the patient including, but not limited to bleeding, infection, vascular injury or contrast induced renal failure. All of the patient's  questions were answered, patient is agreeable to proceed. Consent signed and in chart.   Thank you for this interesting consult.  I greatly enjoyed meeting Muhammad Vacca and look forward to participating in their care.  A copy of this report was sent to the requesting provider on this date.  Electronically Signed: Henreitta Cea 10/22/2015, 2:11 PM   I spent a total of 40 Minutes    in face to face in clinical consultation, greater than 50% of which was counseling/coordinating care for CVA

## 2015-10-22 NOTE — ED Notes (Signed)
Pt unable to walk or bear wt on left side very unstable, neuro at bedside

## 2015-10-22 NOTE — Code Documentation (Signed)
65yo [facility].  Patient reporting that he did not sleep last night and was in the chair when he noticed difficulty using his left arm.  Patient reports that he did not have any symptoms at 0400 this morning.  EMS assessed patient to have left sided weakness and activated a code stroke.  Patient noted to be hypertensive at 220/120 en route per EMS.  Patient with h/o HTN and DM.  Stroke team at the bedside on patient arrival.  Labs drawn and patient cleared for CT by Dr. Fayrene FearingJames.  Dr. Lavon PaganiniNandigam to the bedside.  Patient to CT 2.  CT and CTA head and neck completed.  NIHSS 3, see documentation for details and code stroke times.  Patient with left arm and leg weakness and left arm ataxia.  Patient is outside the window for treatment with tPA.  No acute stroke treatment per Dr. Lavon PaganiniNandigam.  Patient to be admitted for stroke work-up.  Bedside handoff with ED RN Arline Aspindy.

## 2015-10-22 NOTE — Progress Notes (Signed)
Patient ID: Lance PandyRobert Betley, male   DOB: 01-08-1951, 65 y.o.   MRN: [id number] INR  Addendum. 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 Dr Corliss Skainseveshwar

## 2015-10-22 NOTE — H&P (Signed)
Date: 10/22/2015               Patient Name:  Lance Duncan MRN: [id number]  DOB: 24-Mar-1951 Age / Sex: 65 y.o., male   PCP: No primary care provider on file.              Medical Service: Internal Medicine Teaching Service              Attending Physician: Dr. Earl Lagos, MD    First Contact: Lance Duncan, MS4 Pager: [phone]  Second Contact: Dr.  Dineen Kid: [phone]  Third Contact Dr.  Dineen Kid: [phone]-       After Hours (After 5p/  First Contact Pager: [phone]  weekends / holidays): Second Contact Pager: [phone]   Chief Complaint: Left sided weakness  History of Present Illness: Lance Duncan is a 65 year old male with a past medical history significant for HTN, T2DM, and chronic low back pain who presented to the ED due to a several hour history of left sided weakness. He states that he was having trouble sleeping early this morning due to his back pain and tried to sleep in his recliner. Around 4 am, he noticed that his left side was numb and he was unable to move his left arm. When he tried to stand from his recliner to use the restroom, he could barely walk due to weakness of his left leg. He was able to make it to the restroom, but fell headfirst into the bathtub due to weakness. He did not hit his head or hurt himself. There was no one in the home to help him, so he crawled back the living room, which took "quite some time" because he was not able to move his left side. He was able to call a neighbor who brought him to the ED around 9 am. He has never had any symptoms of this nature in the past. He denies headaches, vision changes, loss of consciousness. He had no facial numbness or weakness, and his neighbor did not notice any facial changes or changes in his speech this morning. He did not have any chest pain, palpitations, or shortness of breath. Of note, the patient does state that he snores at night time. He had a sleep study for OSA over 10 years ago, but cannot remember the  results. He did not wear a CPAP after the study.   In the ED vitals on arrival were: BP 191/92, Pulse 71, RR 23, O2 98%. EKG showed normal sinus rhythm with old anterior infarct, non contrast CT was negative for hemorrhage, CTA head showed no acute abnormalities. 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 MRI was ordered, but not performed due to penile implant.   Meds: Current Facility-Administered Medications  Medication Dose Route Frequency Provider Last Rate Last Dose  . aspirin tablet 325 mg  325 mg Oral Daily Courtney Paris, MD      . dextrose 5 % and 0.9% NaCl 1,000 mL infusion   Intravenous Continuous Barnetta Chapel, PA-C      . enoxaparin (LOVENOX) injection 40 mg  40 mg Subcutaneous Q24H Courtney Paris, MD      . insulin aspart (novoLOG) injection 0-5 Units  0-5 Units Subcutaneous QHS Courtney Paris, MD      . insulin aspart (novoLOG) injection 0-9 Units  0-9 Units Subcutaneous TID WC Courtney Paris, MD      . insulin glargine (LANTUS) injection  20 Units  20 Units Subcutaneous QHS Courtney Paris, MD      . labetalol (NORMODYNE,TRANDATE) injection 5 mg  5 mg Intravenous Q2H PRN Courtney Paris, MD      . sodium chloride flush (NS) 0.9 % injection 3 mL  3 mL Intravenous Q12H Courtney Paris, MD   3 mL at 10/22/15 1315   Home Meds: Amlodipine 10 mg daily HCTZ 25 mg daily Insulin (Lantus) 100 unit/mL 66 before first meal and 74 at bedtime Lisinopril 40 mg daily Tramadol 50 mg q6 hrs prn for back pain  Allergies: Allergies as of 10/22/2015  . (No Known Allergies)   Past Medical History  Diagnosis Date  . Hypertension   . Diverticula of small intestine   . Stroke (HCC)   . Type II diabetes mellitus (HCC)   Family History: Mother: heart disease, stroke, HTN Sister: heart disease Brother: stroke in his 15s Past Surgical History  Procedure Laterality Date  . Penile prosthesis implant      10/22/15 PT STATED WHILE IN MRI  . Abdominal  surgery     History reviewed. No pertinent family history. Social History   Social History  . Marital Status: Single    Spouse Name: N/A  . Number of Children: N/A  . Years of Education: N/A   Occupational History  . Not on file.   Social History Main Topics  . Smoking status: Current Every Day Smoker    Types: Cigarettes  . Smokeless tobacco: Not on file  . Alcohol Use: Yes  . Drug Use: Not on file  . Sexual Activity: Not on file   Other Topics Concern  . Not on file   Social History Narrative  . No narrative on file   Social Hx continued: He is retired as of April 2016. He also receives partial disability. He served in [employer] and [facility] He lives alone. He has a 48 year smoking history and smokes 1/2 packs per day. He drinks alcohol occasionally, stating he "has one here and there a couple of times a month."  Review of Systems: Pertinent items are noted in HPI.  Physical Exam: Blood pressure 188/100, pulse 58, temperature 98.7 F (37.1 C), resp. rate 22, SpO2 98 %. General: AA male, alert, cooperative, NAD. HEENT: PERRL, EOMI. Moist mucus membranes Neck: Full range of motion without pain, supple, no lymphadenopathy or carotid bruits Lungs: Clear to ascultation bilaterally, normal work of respiration, no wheezes, rales, rhonchi Heart: RRR, no murmurs, gallops, or rubs Abdomen: Soft, non-tender, non-distended, BS + Extremities: No cyanosis, clubbing, or edema Neurologic: Alert & oriented x3, cranial nerves II-XII intact, sensation intact to light touch. Left UE and LE with 4+/5 strength when compared to the right sided (5/5).   Lab results: BMP  Imaging results:  Ct Angio Head W/cm &/or Wo Cm  10/22/2015  EXAM: CT ANGIOGRAPHY HEAD AND NECK TECHNIQUE: Multidetector CT imaging of the head and neck was performed using the standard protocol during bolus administration of intravenous contrast. Multiplanar CT image  reconstructions and MIPs were obtained to evaluate the vascular anatomy. Carotid stenosis measurements (when applicable) are obtained utilizing NASCET criteria, using the distal internal carotid diameter as the denominator. COMPARISON:  None. FINDINGS: CT HEAD Brain: No intracranial hemorrhage or CT evidence of large acute infarct. Small vessel disease changes. Limited for excluding small acute infarct given white matter changes. MR can be obtained for further delineation if clinically  desired. No worrisome intracranial enhancing lesion. Global atrophy without hydrocephalus. Calvarium and skull base: Arachnoid granulations without destructive lesion. Paranasal sinuses: Dental disease with periapical lucency upper teeth without adjacent sinus disease. Mastoid air cells and middle ear cavities are clear. Orbits: Minimal exophthalmos. CTA NECK Aortic arch: Common origin innominate and left common carotid artery. Plaque aortic arch and origin of great vessels with mild slightly moderate narrowing proximal left subclavian artery. Right carotid system: Calcified plaque carotid bifurcation with 48% diameter stenosis distal right common carotid artery. Calcified plaque proximal right internal carotid artery with less than 50% diameter stenosis. 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 This is flow limiting as the left internal carotid artery beyond this region is narrowed. Vertebral arteries:Left vertebral artery is slightly dominant size. Mild irregularity of the cervical segment of the vertebral arteries bilaterally. Skeleton: Dental disease. 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 Mild asymmetry palatine tonsils/ piriform sinus without mass identified. Upper chest: Chronic lung changes possibly with superimposed pulmonary vascular  prominence. CTA HEAD Anterior circulation: No medium or large size vessel significant stenosis or occlusion. Middle cerebral artery branch vessel narrowing irregularity bilaterally. Plaque internal carotid artery cavernous segment without high-grade stenosis. There may be 2 tiny (1 mm) aneurysm of the right internal carotid artery cavernous segment. Fetal type contribution to the posterior cerebral arteries. Posterior circulation: Narrowed irregular distal vertebral arteries and basilar artery. Irregular narrowed posterior inferior cerebellar arteries, anterior inferior cerebral arteries and superior cerebellar arteries. Venous sinuses: Patent. Anatomic variants: As above. Delayed phase: As above. IMPRESSION: CT HEAD No intracranial hemorrhage or CT evidence of large acute infarct. Small vessel disease changes. Global atrophy without hydrocephalus. CTA NECK Mild to slightly moderate narrowing proximal left subclavian artery. Calcified plaque right carotid bifurcation with 48% diameter stenosis distal right common carotid artery. Calcified plaque proximal right internal carotid artery with less than 50% diameter stenosis. 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 This is flow limiting as the left internal carotid artery beyond this region is narrowed. Left vertebral artery is slightly dominant size. Mild irregularity of the cervical segment of the vertebral arteries bilaterally. Dental disease. 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 Chronic lung changes possibly with superimposed pulmonary vascular prominence. CTA HEAD Anterior circulation without medium or large size vessel significant stenosis or occlusion. Middle cerebral artery branch vessel narrowing and irregularity bilaterally. Plaque internal carotid artery cavernous segment without high-grade  stenosis. There may be 2 tiny (1 mm) aneurysms of the right internal carotid artery cavernous segment. Fetal type contribution to the posterior cerebral arteries. Irregular narrowed distal vertebral arteries and basilar artery. Irregular narrowed posterior inferior cerebellar arteries, anterior inferior cerebral arteries and superior cerebellar arteries. These results were called by telephone at the time of interpretation on 10/22/2015 at 9:42 am to Dr. Lavon Paganini who verbally acknowledged these results. Electronically Signed   By: Lacy Duverney M.D.   On: 10/22/2015 10:22   Ct Head Wo Contrast  10/22/2015  CLINICAL DATA:  Code stroke.  New onset left-sided weakness. EXAM: CT HEAD WITHOUT CONTRAST TECHNIQUE: Contiguous axial images were obtained from the base of the skull through the vertex without intravenous contrast. COMPARISON:  None. FINDINGS: No acute infarct, hemorrhage, or mass lesion is present. Mild generalized  atrophy and scattered subcortical white matter hypoattenuation is advanced for age. The cortex is intact. The basal ganglia are intact. The insular ribbon is within normal limits. The brainstem and cerebellum are unremarkable. No significant extra-axial fluid collection is present. Atherosclerotic changes are present within the cavernous internal carotid arteries bilaterally. The paranasal sinuses and mastoid air cells are clear. The calvarium is intact. No significant extracranial soft tissue lesions are present. The globes and orbits are intact. ASPECTS score = 10/10 [place] Stroke Program Early CT Score Normal score = 10 IMPRESSION: 1. No acute intracranial abnormality. 2. Mild generalized atrophy and white matter disease is slightly advanced for age. This likely reflects the sequela of chronic microvascular ischemia. These results were called by telephone at the time of interpretation on 10/22/2015 at 9:22 am to Dr. Lavon Paganini , who verbally acknowledged these results. Electronically Signed   By:  Marin Roberts M.D.   On: 10/22/2015 09:24   Ct Angio Neck W/cm &/or Wo/cm  10/22/2015  EXAM: CT ANGIOGRAPHY HEAD AND NECK TECHNIQUE: Multidetector CT imaging of the head and neck was performed using the standard protocol during bolus administration of intravenous contrast. Multiplanar CT image reconstructions and MIPs were obtained to evaluate the vascular anatomy. Carotid stenosis measurements (when applicable) are obtained utilizing NASCET criteria, using the distal internal carotid diameter as the denominator. COMPARISON:  None. FINDINGS: CT HEAD Brain: No intracranial hemorrhage or CT evidence of large acute infarct. Small vessel disease changes. Limited for excluding small acute infarct given white matter changes. MR can be obtained for further delineation if clinically desired. No worrisome intracranial enhancing lesion. Global atrophy without hydrocephalus. Calvarium and skull base: Arachnoid granulations without destructive lesion. Paranasal sinuses: Dental disease with periapical lucency upper teeth without adjacent sinus disease. Mastoid air cells and middle ear cavities are clear. Orbits: Minimal exophthalmos. CTA NECK Aortic arch: Common origin innominate and left common carotid artery. Plaque aortic arch and origin of great vessels with mild slightly moderate narrowing proximal left subclavian artery. Right carotid system: Calcified plaque carotid bifurcation with 48% diameter stenosis distal right common carotid artery. Calcified plaque proximal right internal carotid artery with less than 50% diameter stenosis. 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 This is flow limiting as the left internal carotid artery beyond this region is narrowed. Vertebral arteries:Left vertebral artery is slightly dominant size. Mild irregularity of the cervical segment of the vertebral arteries bilaterally.  Skeleton: Dental disease. 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 Mild asymmetry palatine tonsils/ piriform sinus without mass identified. Upper chest: Chronic lung changes possibly with superimposed pulmonary vascular prominence. CTA HEAD Anterior circulation: No medium or large size vessel significant stenosis or occlusion. Middle cerebral artery branch vessel narrowing irregularity bilaterally. Plaque internal carotid artery cavernous segment without high-grade stenosis. There may be 2 tiny (1 mm) aneurysm of the right internal carotid artery cavernous segment. Fetal type contribution to the posterior cerebral arteries. Posterior circulation: Narrowed irregular distal vertebral arteries and basilar artery. Irregular narrowed posterior inferior cerebellar arteries, anterior inferior cerebral arteries and superior cerebellar arteries. Venous sinuses: Patent. Anatomic variants: As above. Delayed phase: As above. IMPRESSION: CT HEAD No intracranial hemorrhage or CT evidence of large acute infarct. Small vessel disease changes. Global atrophy without hydrocephalus. CTA NECK Mild to slightly moderate narrowing proximal left subclavian artery. Calcified plaque right carotid bifurcation with 48% diameter stenosis  distal right common carotid artery. Calcified plaque proximal right internal carotid artery with less than 50% diameter stenosis. 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 This is flow limiting as the left internal carotid artery beyond this region is narrowed. Left vertebral artery is slightly dominant size. Mild irregularity of the cervical segment of the vertebral arteries bilaterally. Dental disease. 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 Chronic lung changes  possibly with superimposed pulmonary vascular prominence. CTA HEAD Anterior circulation without medium or large size vessel significant stenosis or occlusion. Middle cerebral artery branch vessel narrowing and irregularity bilaterally. Plaque internal carotid artery cavernous segment without high-grade stenosis. There may be 2 tiny (1 mm) aneurysms of the right internal carotid artery cavernous segment. Fetal type contribution to the posterior cerebral arteries. Irregular narrowed distal vertebral arteries and basilar artery. Irregular narrowed posterior inferior cerebellar arteries, anterior inferior cerebral arteries and superior cerebellar arteries. These results were called by telephone at the time of interpretation on 10/22/2015 at 9:42 am to Dr. Lavon PaganiniNandigam who verbally acknowledged these results. Electronically Signed   By: Lacy DuverneySteven  Olson M.D.   On: 10/22/2015 10:22     Assessment & Plan by Problem: Active Problems:   Weakness   Stroke [facility](HCC)  Lance PandyRobert Duncan is a [age] year old male with a past medical history significant for HTN, T2DM, and chronic low back pain who presented to the ED due to a several hour history of left sided weakness.   CVA vs TIA: Presented with left sided weakness this am with MSK exam in the ED with 3/5 weakness in left upper and lower extremity with no other neurological deficits. Now exam improved to 4+/5 weakness in LUE and LLE. Non contrast CT showed no evidence of hemorrhage, and CTA head showed no acute abnormalities. 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 MRI was ordered, but not performed due to penile implant. This is likely a TIA given the transient nature of symptoms. He does have significant family history of cardiovascular and cerebrovascular disease. He has T2DM that he states is well controlled and has HTN treated with lisinopril, HCTZ, and amlodipine. IR perform cerebral angiogram to further evaluate.    -Monitor on telemetry -ECHO in am -Lipid panel in the am -A1c in the am -Asprin 325 mg -PT/OT to eval  HTN: BP elevated on admission. Takes meds as above at home. -Hold home BP medications for permissive HTN -Give Labetalol 5 mg IV q2h prn for SBP >200, DBP >110  DM type II: Uses only Lantus at home, says he uses 30 units daily. Had episode of hypoglycemia while NPO -Hypoglycemia protocol. Give D5 NS @ 75 cc/hr while NPO -Restart Lantus 20 units + ISS-S + HS coverage when not NPO for procedure  AKI vs CKD: Cr 1.51 on admission. Unclear what his baseline is.   DVT/PE PPx: Lovenox [place]  This is a Psychologist, occupationalMedical Student Note.  The care of the patient was discussed with Dr.  and the assessment and plan was formulated with their assistance.  Please see their note for official documentation of the patient encounter.   Signed: Bennye Almiffany D Kalisi Duncan, Med Student 10/22/2015, 4:12 PM

## 2015-10-22 NOTE — ED Notes (Signed)
Pt states that at 4 am he reached for remote with left hand  And then after left hand and arm felt weak and he had difficulty lifting left arm, pt cannot support weight on left leg either has obvious left sided weakness upon arrival to er , all code strike team at bedside CBG was 83 per ems no blood thinners is on meds for HTN

## 2015-10-22 NOTE — ED Notes (Signed)
Per  Neuro dr, keep pt in our room till Lance Duncan comes to do IR

## 2015-10-22 NOTE — ED Notes (Signed)
Pt to be NPO for IR study with dr Thayer Jewdevonshire

## 2015-10-22 NOTE — Consult Note (Signed)
Requesting Physician: Dr. Fayrene Fearing, ER      Reason for consultation:  Code Stroke  HPI:                                                                                                                                         Lance Duncan is an 65 y.o. male patient who  was brought into the emergency room , with left sided weakness symptoms, onset at 4 am. He arrived at 9.15 am in ER.  Denies any vision sx, no new numbness. He was up all night due to insomnia.   Patient lives alone. He does not have any immediate family in the area.   Date last known well:  2017, April 17th Time last known well:  At  4 am tPA Given: No: outside time window  Stroke Risk Factors - diabetes mellitus and hypertension  Past Medical History: No past medical history on file.  No past surgical history on file.  Family History: No family history on file.  Social History:   has no tobacco, alcohol, and drug history on file.  Allergies:  Allergies not on file   Medications:                                                                                                                        No current facility-administered medications for this encounter. No current outpatient prescriptions on file.   ROS:                                                                                                                                       History obtained from the patient  General ROS: negative for - chills, fatigue, fever, night sweats, weight gain or weight loss Psychological ROS: negative for - behavioral disorder, hallucinations, memory  difficulties, mood swings or suicidal ideation Ophthalmic ROS: negative for - blurry vision, double vision, eye pain or loss of vision ENT ROS: negative for - epistaxis, nasal discharge, oral lesions, sore throat, tinnitus or vertigo Allergy and Immunology ROS: negative for - hives or itchy/watery eyes Hematological and Lymphatic ROS: negative for - bleeding problems,  bruising or swollen lymph nodes Endocrine ROS: negative for - galactorrhea, hair pattern changes, polydipsia/polyuria or temperature intolerance Respiratory ROS: negative for - cough, hemoptysis, shortness of breath or wheezing Cardiovascular ROS: negative for - chest pain, dyspnea on exertion, edema or irregular heartbeat Gastrointestinal ROS: negative for - abdominal pain, diarrhea, hematemesis, nausea/vomiting or stool incontinence Genito-Urinary ROS: negative for - dysuria, hematuria, incontinence or urinary frequency/urgency Musculoskeletal ROS: negative for - joint swelling or muscular weakness Neurological ROS: as noted in HPI Dermatological ROS: negative for rash and skin lesion changes  Neurologic Examination:                                                                                                    Today's Vitals   10/22/15 0916  SpO2: 99%    Evaluation of higher integrative functions including: Level of alertness: Alert,  Oriented to time, place and person Recent and remote memory - intact   Attention span and concentration  - intact   Speech: fluent, no evidence of dysarthria or aphasia noted.  Test the following cranial nerves: 2-12 grossly intact Motor examination: Normal tone, bulk, full 5/5 motor strength in right upper and lower extremities, minimal 4+/5 weakness with left deltoid, full strength distally in left UE, 4/5 weakness with left leg Examination of sensation :  symmetric sensation proximally in all 4 extremities and on face Examination of deep tendon reflexes: 2+, normal and symmetric in all extremities, normal plantars bilaterally Test coordination: finger nose testing showed left UE appendicular ataxia, normal on right. No abnormal involuntary movements or tremors noted.  Gait:Very unsteady with severe gait ataxia noted  Lab Results: Basic Metabolic Panel:  Recent Labs Lab 10/22/15 0913  NA 139  K 3.6  CL 105  GLUCOSE 71  BUN 18  CREATININE  1.50*    Liver Function Tests: No results for input(s): AST, ALT, ALKPHOS, BILITOT, PROT, ALBUMIN in the last 168 hours. No results for input(s): LIPASE, AMYLASE in the last 168 hours. No results for input(s): AMMONIA in the last 168 hours.  CBC:  Recent Labs Lab 10/22/15 0913  HGB 15.6  HCT 46.0    Cardiac Enzymes: No results for input(s): CKTOTAL, CKMB, CKMBINDEX, TROPONINI in the last 168 hours.  Lipid Panel: No results for input(s): CHOL, TRIG, HDL, CHOLHDL, VLDL, LDLCALC in the last 168 hours.  CBG:  Recent Labs Lab 10/22/15 0904  GLUCAP 71    Microbiology: No results found for this or any previous visit.   Imaging: No results found.   Stroke Scales   NIHSS :  3  [place] Stroke Program Early CT score (ASPECTS) : 10   Pre stroke Modified Rankin Scale (mRS): 0   Assessment and plan:   Lance Duncan is an 65  y.o. male patient who presented with left sided weakness symptoms, onset at 4 am, evaluate at 9.15 am in ER, outside iv tpa window. NIHSS 3.  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 About 50% stenosis of the right ICA noted. 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 Sharin Graveeveshwer. Patient underwent diagnostic 4 vessel arteriogram which revealed greater than 95% preocclusive left ICA stenosis. Would defer further management with elective procedure to address the left ICA preocclusive stenosis to Dr. Pearlean BrownieSethi who will be following the patient starting tomorrow morning.   On aspirin 325 g daily and Plavix 75 mg daily now. Patient was not on any anti-platelets medications at home. Check A1C, Lipid profile  Will be admitted to hospitalist service for monitoring and completion of stroke work up.  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  TTE ordered, to be placed on telemetry monitoring, PT/OT/ST.   Stroke team to follow up starting tomorrow am.

## 2015-10-22 NOTE — Sedation Documentation (Signed)
CBG 53, 12.5 G dextrose given

## 2015-10-22 NOTE — ED Notes (Signed)
Return to room from angio and ct

## 2015-10-22 NOTE — H&P (Signed)
Date: 10/22/2015               Patient Name:  Lance Duncan MRN: [id number]  DOB: 1951/01/24 Age / Sex: 65 y.o., male   PCP: No primary care provider on file.         Medical Service: Internal Medicine Teaching Service         Attending Physician: Dr. Earl Lagos, MD    First Contact: Dr. Earnest Conroy Pager: [phone]  Second Contact: Dr. Allena Katz Pager: [phone]       After Hours (After 5p/  First Contact Pager: [phone]  weekends / holidays): Second Contact Pager: [phone]   Chief Complaint: Left-sided weakness  History of Present Illness: [name] is a 65 y.o. male w/ PMHx of HTN, DM type II, presents to the ED w/ complaints of left-sided weakness. Patient says he has chronic back pain, and was not sleeping well this evening because of this. While in and out of sleep while sitting in his recliner, he said he noticed some mild left-sided numbness in his hand starting around 3 AM. He then states that around 5 AM, he started to feel some left hand weakness when trying to reach for his channel changer. He had such significant hand weakness that he had to reach for the remote with his right hand. He then attempted to get up and use the restroom and was unable to support his weight with his left leg and had severe weakness and was unable to stand. He denies any facial numbness or weakness/asymmetry. No confusion, dizziness, or lightheadedness, no difficulty swallowing or slurred speech. No palpitations, chest pain, or SOB. Says he has not had these symptoms in the past.   Meds: Current Facility-Administered Medications  Medication Dose Route Frequency Provider Last Rate Last Dose  . enoxaparin (LOVENOX) injection 40 mg  40 mg Subcutaneous Q24H Courtney Paris, MD      . insulin aspart (novoLOG) injection 0-5 Units  0-5 Units Subcutaneous QHS Courtney Paris, MD      . insulin aspart (novoLOG) injection 0-9 Units  0-9 Units Subcutaneous TID WC Courtney Paris, MD      . insulin glargine  (LANTUS) injection 20 Units  20 Units Subcutaneous QHS Courtney Paris, MD      . sodium chloride flush (NS) 0.9 % injection 3 mL  3 mL Intravenous Q12H Courtney Paris, MD       Current Outpatient Prescriptions  Medication Sig Dispense Refill  . amLODipine (NORVASC) 10 MG tablet Take 10 mg by mouth daily.    . hydrochlorothiazide (HYDRODIURIL) 25 MG tablet Take 25 mg by mouth daily.    . insulin glargine (LANTUS) 100 unit/mL SOPN Inject 66-74 Units into the skin at bedtime. Take 66 units before the first meal, and 74 units at bedtime    . lisinopril (PRINIVIL,ZESTRIL) 40 MG tablet Take 40 mg by mouth daily.    . traMADol (ULTRAM) 50 MG tablet Take 50 mg by mouth every 6 (six) hours as needed for severe pain.      Allergies: Allergies as of 10/22/2015  . (Not on File)   Past Medical History  Diagnosis Date  . Hypertension   . Diverticula of small intestine   . Stroke [facility])    Past Surgical History  Procedure Laterality Date  . Penile prosthesis implant      10/22/15 PT STATED WHILE IN MRI  . Abdominal surgery     No family history on file.  Social History   Social History  . Marital Status: Single    Spouse Name: N/A  . Number of Children: N/A  . Years of Education: N/A   Occupational History  . Not on file.   Social History Main Topics  . Smoking status: Current Every Day Smoker    Types: Cigarettes  . Smokeless tobacco: Not on file  . Alcohol Use: Yes  . Drug Use: Not on file  . Sexual Activity: Not on file   Other Topics Concern  . Not on file   Social History Narrative  . No narrative on file    Review of Systems: General: Denies fever, chills, diaphoresis, appetite change and fatigue.  Respiratory: Denies SOB, DOE, cough, chest tightness, and wheezing.   Cardiovascular: Denies chest pain and palpitations.  Gastrointestinal: Denies nausea, vomiting, abdominal pain, diarrhea, constipation, blood in stool and abdominal distention.  Genitourinary: Denies dysuria,  urgency, frequency, hematuria, and flank pain. Endocrine: Denies hot or cold intolerance, polyuria, and polydipsia. Musculoskeletal: Denies myalgias, back pain, joint swelling, arthralgias and gait problem.  Skin: Denies pallor, rash and wounds.  Neurological: Positive for left-sided weakness. Denies dizziness, seizures, syncope, lightheadedness, numbness and headaches.  Psychiatric/Behavioral: Denies mood changes, confusion, nervousness, sleep disturbance and agitation.    Physical Exam: Blood pressure 193/78, pulse 57, resp. rate 20, SpO2 97 %.  General: AA male, alert, cooperative, NAD. HEENT: PERRL, EOMI. Moist mucus membranes Neck: Full range of motion without pain, supple, no lymphadenopathy or carotid bruits Lungs: Clear to ascultation bilaterally, normal work of respiration, no wheezes, rales, rhonchi Heart: RRR, no murmurs, gallops, or rubs Abdomen: Soft, non-tender, non-distended, BS + Extremities: No cyanosis, clubbing, or edema Neurologic: Alert & oriented x3, cranial nerves II-XII intact, sensation intact to light touch. Left UE and LE with 4+/5 strength when compared to the right sided (5/5).    Lab results: Basic Metabolic Panel:  Recent Labs  69/62/95 0911 10/22/15 0913  NA 137 139  K 3.7 3.6  CL 106 105  CO2 21*  --   GLUCOSE 75 71  BUN 16 18  CREATININE 1.51* 1.50*  CALCIUM 8.9  --    Liver Function Tests:  Recent Labs  10/22/15 0911  AST 16  ALT 10*  ALKPHOS 81  BILITOT 0.5  PROT 7.2  ALBUMIN 3.2*   CBC:  Recent Labs  10/22/15 0911 10/22/15 0913  WBC 14.1*  --   NEUTROABS 10.1*  --   HGB 13.8 15.6  HCT 41.1 46.0  MCV 78.9  --   PLT 218  --    CBG:  Recent Labs  10/22/15 0904  GLUCAP 71   Coagulation:  Recent Labs  10/22/15 0911  LABPROT 14.3  INR 1.09   Urine Drug Screen: Drugs of Abuse     Component Value Date/Time   LABOPIA NONE DETECTED 10/22/2015 1100   COCAINSCRNUR NONE DETECTED 10/22/2015 1100   LABBENZ NONE  DETECTED 10/22/2015 1100   AMPHETMU NONE DETECTED 10/22/2015 1100   THCU NONE DETECTED 10/22/2015 1100   LABBARB NONE DETECTED 10/22/2015 1100    Alcohol Level:  Recent Labs  10/22/15 0912  ETH <5   Urinalysis:  Recent Labs  10/22/15 1040  COLORURINE YELLOW  LABSPEC 1.022  PHURINE 5.5  GLUCOSEU NEGATIVE  HGBUR MODERATE*  BILIRUBINUR NEGATIVE  KETONESUR NEGATIVE  PROTEINUR >300*  NITRITE NEGATIVE  LEUKOCYTESUR NEGATIVE    Imaging results:  Ct Angio Head W/cm &/or Wo Cm  10/22/2015  EXAM: CT ANGIOGRAPHY HEAD AND  NECK TECHNIQUE: Multidetector CT imaging of the head and neck was performed using the standard protocol during bolus administration of intravenous contrast. Multiplanar CT image reconstructions and MIPs were obtained to evaluate the vascular anatomy. Carotid stenosis measurements (when applicable) are obtained utilizing NASCET criteria, using the distal internal carotid diameter as the denominator. COMPARISON:  None. FINDINGS: CT HEAD Brain: No intracranial hemorrhage or CT evidence of large acute infarct. Small vessel disease changes. Limited for excluding small acute infarct given white matter changes. MR can be obtained for further delineation if clinically desired. No worrisome intracranial enhancing lesion. Global atrophy without hydrocephalus. Calvarium and skull base: Arachnoid granulations without destructive lesion. Paranasal sinuses: Dental disease with periapical lucency upper teeth without adjacent sinus disease. Mastoid air cells and middle ear cavities are clear. Orbits: Minimal exophthalmos. CTA NECK Aortic arch: Common origin innominate and left common carotid artery. Plaque aortic arch and origin of great vessels with mild slightly moderate narrowing proximal left subclavian artery. Right carotid system: Calcified plaque carotid bifurcation with 48% diameter stenosis distal right common carotid artery. Calcified plaque proximal right internal carotid artery with  less than 50% diameter stenosis. 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 This is flow limiting as the left internal carotid artery beyond this region is narrowed. Vertebral arteries:Left vertebral artery is slightly dominant size. Mild irregularity of the cervical segment of the vertebral arteries bilaterally. Skeleton: Dental disease. 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 Mild asymmetry palatine tonsils/ piriform sinus without mass identified. Upper chest: Chronic lung changes possibly with superimposed pulmonary vascular prominence. CTA HEAD Anterior circulation: No medium or large size vessel significant stenosis or occlusion. Middle cerebral artery branch vessel narrowing irregularity bilaterally. Plaque internal carotid artery cavernous segment without high-grade stenosis. There may be 2 tiny (1 mm) aneurysm of the right internal carotid artery cavernous segment. Fetal type contribution to the posterior cerebral arteries. Posterior circulation: Narrowed irregular distal vertebral arteries and basilar artery. Irregular narrowed posterior inferior cerebellar arteries, anterior inferior cerebral arteries and superior cerebellar arteries. Venous sinuses: Patent. Anatomic variants: As above. Delayed phase: As above. IMPRESSION: CT HEAD No intracranial hemorrhage or CT evidence of large acute infarct. Small vessel disease changes. Global atrophy without hydrocephalus. CTA NECK Mild to slightly moderate narrowing proximal left subclavian artery. Calcified plaque right carotid bifurcation with 48% diameter stenosis distal right common carotid artery. Calcified plaque proximal right internal carotid artery with less than 50% diameter stenosis. Plaque left carotid bifurcation with almost complete occlusion of the proximal left  internal carotid artery with string sign. This is flow limiting as the left internal carotid artery beyond this region is narrowed. Left vertebral artery is slightly dominant size. Mild irregularity of the cervical segment of the vertebral arteries bilaterally. Dental disease. 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 Chronic lung changes possibly with superimposed pulmonary vascular prominence. CTA HEAD Anterior circulation without medium or large size vessel significant stenosis or occlusion. Middle cerebral artery branch vessel narrowing and irregularity bilaterally. Plaque internal carotid artery cavernous segment without high-grade stenosis. There may be 2 tiny (1 mm) aneurysms of the right internal carotid artery cavernous segment. Fetal type contribution to the posterior cerebral arteries. Irregular narrowed distal vertebral arteries and basilar artery. Irregular narrowed posterior inferior cerebellar arteries, anterior inferior cerebral arteries and superior cerebellar arteries. These  results were called by telephone at the time of interpretation on 10/22/2015 at 9:42 am to Dr. Lavon Paganini who verbally acknowledged these results. Electronically Signed   By: Lacy Duverney M.D.   On: 10/22/2015 10:22   Ct Head Wo Contrast  10/22/2015  CLINICAL DATA:  Code stroke.  New onset left-sided weakness. EXAM: CT HEAD WITHOUT CONTRAST TECHNIQUE: Contiguous axial images were obtained from the base of the skull through the vertex without intravenous contrast. COMPARISON:  None. FINDINGS: No acute infarct, hemorrhage, or mass lesion is present. Mild generalized atrophy and scattered subcortical white matter hypoattenuation is advanced for age. The cortex is intact. The basal ganglia are intact. The insular ribbon is within normal limits. The brainstem and cerebellum are unremarkable. No significant extra-axial fluid collection is present.  Atherosclerotic changes are present within the cavernous internal carotid arteries bilaterally. The paranasal sinuses and mastoid air cells are clear. The calvarium is intact. No significant extracranial soft tissue lesions are present. The globes and orbits are intact. ASPECTS score = 10/10 [place] Stroke Program Early CT Score Normal score = 10 IMPRESSION: 1. No acute intracranial abnormality. 2. Mild generalized atrophy and white matter disease is slightly advanced for age. This likely reflects the sequela of chronic microvascular ischemia. These results were called by telephone at the time of interpretation on 10/22/2015 at 9:22 am to Dr. Lavon Paganini , who verbally acknowledged these results. Electronically Signed   By: Marin Roberts M.D.   On: 10/22/2015 09:24   Ct Angio Neck W/cm &/or Wo/cm  10/22/2015  EXAM: CT ANGIOGRAPHY HEAD AND NECK TECHNIQUE: Multidetector CT imaging of the head and neck was performed using the standard protocol during bolus administration of intravenous contrast. Multiplanar CT image reconstructions and MIPs were obtained to evaluate the vascular anatomy. Carotid stenosis measurements (when applicable) are obtained utilizing NASCET criteria, using the distal internal carotid diameter as the denominator. COMPARISON:  None. FINDINGS: CT HEAD Brain: No intracranial hemorrhage or CT evidence of large acute infarct. Small vessel disease changes. Limited for excluding small acute infarct given white matter changes. MR can be obtained for further delineation if clinically desired. No worrisome intracranial enhancing lesion. Global atrophy without hydrocephalus. Calvarium and skull base: Arachnoid granulations without destructive lesion. Paranasal sinuses: Dental disease with periapical lucency upper teeth without adjacent sinus disease. Mastoid air cells and middle ear cavities are clear. Orbits: Minimal exophthalmos. CTA NECK Aortic arch: Common origin innominate and left common carotid  artery. Plaque aortic arch and origin of great vessels with mild slightly moderate narrowing proximal left subclavian artery. Right carotid system: Calcified plaque carotid bifurcation with 48% diameter stenosis distal right common carotid artery. Calcified plaque proximal right internal carotid artery with less than 50% diameter stenosis. 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 This is flow limiting as the left internal carotid artery beyond this region is narrowed. Vertebral arteries:Left vertebral artery is slightly dominant size. Mild irregularity of the cervical segment of the vertebral arteries bilaterally. Skeleton: Dental disease. 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 Mild asymmetry palatine tonsils/ piriform sinus without mass identified. Upper chest: Chronic lung changes possibly with superimposed pulmonary vascular prominence. CTA HEAD Anterior circulation: No medium or large size vessel significant stenosis or occlusion. Middle cerebral artery branch vessel narrowing irregularity bilaterally. Plaque internal carotid artery cavernous segment without high-grade stenosis. There may be 2 tiny (1  mm) aneurysm of the right internal carotid artery cavernous segment. Fetal type contribution to the posterior cerebral arteries. Posterior circulation: Narrowed irregular distal vertebral arteries and basilar artery. Irregular narrowed posterior inferior cerebellar arteries, anterior inferior cerebral arteries and superior cerebellar arteries. Venous sinuses: Patent. Anatomic variants: As above. Delayed phase: As above. IMPRESSION: CT HEAD No intracranial hemorrhage or CT evidence of large acute infarct. Small vessel disease changes. Global atrophy without hydrocephalus. CTA NECK Mild to slightly moderate narrowing proximal  left subclavian artery. Calcified plaque right carotid bifurcation with 48% diameter stenosis distal right common carotid artery. Calcified plaque proximal right internal carotid artery with less than 50% diameter stenosis. 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 This is flow limiting as the left internal carotid artery beyond this region is narrowed. Left vertebral artery is slightly dominant size. Mild irregularity of the cervical segment of the vertebral arteries bilaterally. Dental disease. 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 Chronic lung changes possibly with superimposed pulmonary vascular prominence. CTA HEAD Anterior circulation without medium or large size vessel significant stenosis or occlusion. Middle cerebral artery branch vessel narrowing and irregularity bilaterally. Plaque internal carotid artery cavernous segment without high-grade stenosis. There may be 2 tiny (1 mm) aneurysms of the right internal carotid artery cavernous segment. Fetal type contribution to the posterior cerebral arteries. Irregular narrowed distal vertebral arteries and basilar artery. Irregular narrowed posterior inferior cerebellar arteries, anterior inferior cerebral arteries and superior cerebellar arteries. These results were called by telephone at the time of interpretation on 10/22/2015 at 9:42 am to Dr. Lavon Paganini who verbally acknowledged these results. Electronically Signed   By: Lacy Duverney M.D.   On: 10/22/2015 10:22    Other results: EKG: NSR  Assessment & Plan by Problem: 65 y.o. male w/ PMHx of HTN, DM type II, admitted for left-sided weakness.   Stroke vs TIA: Patient with left-sided weakness this AM. On exam, weakness almost completely resolved (4+/5 strength on the left). No other findings. Neuro exam otherwise benign. No swallowing problems,  sensory deficits, or facial abnormalities. Says he has never had these symptoms in the past. Says he has HTN and has been told he has HLD in the past. Takes lisinopril, HCTZ, and Norvasc for his BP. Says his DM is pretty well controlled. CT head on admission shows no acute abnormalities. 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 This finding does not correlate with his symptoms. Not able to obtain an MRI because patient has penile implant and does not have any information about it. CTA also shows incidental goiter.  -Admit to telemetry -Neurology following; patient will go for diagnostic angiogram for further workup of narrowing.  -ECHO in AM -Check Lipid panel, HbA1c in AM -PT/OT -ASA 325 mg daily  HTN: BP elevated on admission. Takes meds as above at home. -Hold home BP medications for permissive HTN -Give Labetalol 5 mg IV q2h prn for SBP >200, DBP >110  DM type II: Uses only Lantus at home, says he uses 30 units daily. Had episode of hypoglycemia while NPO -Hypoglycemia protocol. Give D5 NS @ 75 cc/hr while NPO -Restart Lantus 20 units + ISS-S + HS coverage when not NPO for procedure  AKI vs CKD: Cr 1.51 on admission. Unclear what his baseline is.  DVT/PE PPx: Lovenox [place]  Dispo: Disposition is deferred at this time, awaiting improvement of current medical problems. Anticipated discharge in approximately 1-2 day(s).   The patient does not have a current PCP (No primary care provider on file.) and does need an [facility] hospital follow-up appointment after discharge.  The patient does not have transportation limitations that hinder transportation to clinic appointments.  Signed: Courtney Paris, MD 10/22/2015, 1:21 PM

## 2015-10-23 ENCOUNTER — Encounter (HOSPITAL_COMMUNITY): Payer: Self-pay | Admitting: Physician Assistant

## 2015-10-23 ENCOUNTER — Observation Stay (HOSPITAL_BASED_OUTPATIENT_CLINIC_OR_DEPARTMENT_OTHER): Payer: Non-veteran care

## 2015-10-23 DIAGNOSIS — N183 Chronic kidney disease, stage 3 (moderate): Secondary | ICD-10-CM | POA: Diagnosis not present

## 2015-10-23 DIAGNOSIS — R531 Weakness: Secondary | ICD-10-CM

## 2015-10-23 DIAGNOSIS — I6789 Other cerebrovascular disease: Secondary | ICD-10-CM | POA: Diagnosis not present

## 2015-10-23 DIAGNOSIS — G459 Transient cerebral ischemic attack, unspecified: Secondary | ICD-10-CM | POA: Diagnosis not present

## 2015-10-23 DIAGNOSIS — I129 Hypertensive chronic kidney disease with stage 1 through stage 4 chronic kidney disease, or unspecified chronic kidney disease: Secondary | ICD-10-CM | POA: Diagnosis not present

## 2015-10-23 DIAGNOSIS — I6522 Occlusion and stenosis of left carotid artery: Secondary | ICD-10-CM

## 2015-10-23 DIAGNOSIS — E1122 Type 2 diabetes mellitus with diabetic chronic kidney disease: Secondary | ICD-10-CM | POA: Diagnosis not present

## 2015-10-23 DIAGNOSIS — G8929 Other chronic pain: Secondary | ICD-10-CM

## 2015-10-23 DIAGNOSIS — G451 Carotid artery syndrome (hemispheric): Secondary | ICD-10-CM | POA: Diagnosis not present

## 2015-10-23 DIAGNOSIS — Z794 Long term (current) use of insulin: Secondary | ICD-10-CM

## 2015-10-23 DIAGNOSIS — M545 Low back pain: Secondary | ICD-10-CM

## 2015-10-23 LAB — LIPID PANEL
CHOL/HDL RATIO: 7.8 ratio
CHOLESTEROL: 195 mg/dL (ref 0–200)
HDL: 25 mg/dL — ABNORMAL LOW (ref >40)
LDL Cholesterol: 130 mg/dL — ABNORMAL HIGH (ref 0–99)
Triglycerides: 199 mg/dL — ABNORMAL HIGH (ref <150)
VLDL: 40 mg/dL (ref 0–40)

## 2015-10-23 LAB — GLUCOSE, CAPILLARY
GLUCOSE-CAPILLARY: 62 mg/dL — AB (ref 65–99)
GLUCOSE-CAPILLARY: 78 mg/dL (ref 65–99)
GLUCOSE-CAPILLARY: 123 mg/dL — AB (ref 65–99)
Glucose-Capillary: 44 mg/dL — CL (ref 65–99)
Glucose-Capillary: 154 mg/dL — ABNORMAL HIGH (ref 65–99)

## 2015-10-23 LAB — CBC
HCT: 39.8 % (ref 39.0–52.0)
Hemoglobin: 13 g/dL (ref 13.0–17.0)
MCH: 25.7 pg — AB (ref 26.0–34.0)
MCHC: 32.7 g/dL (ref 30.0–36.0)
MCV: 78.8 fL (ref 78.0–100.0)
PLATELETS: 194 10*3/uL (ref 150–400)
RBC: 5.05 MIL/uL (ref 4.22–5.81)
RDW: 14.6 % (ref 11.5–15.5)
WBC: 6.6 10*3/uL (ref 4.0–10.5)

## 2015-10-23 LAB — BASIC METABOLIC PANEL
Anion gap: 11 (ref 5–15)
BUN: 17 mg/dL (ref 6–20)
CALCIUM: 8.5 mg/dL — AB (ref 8.9–10.3)
CO2: 22 mmol/L (ref 22–32)
CREATININE: 1.52 mg/dL — AB (ref 0.61–1.24)
Chloride: 104 mmol/L (ref 101–111)
GFR calc non Af Amer: 47 mL/min — ABNORMAL LOW (ref >60)
GFR, EST AFRICAN AMERICAN: 55 mL/min — AB (ref >60)
Glucose, Bld: 70 mg/dL (ref 65–99)
Potassium: 3.8 mmol/L (ref 3.5–5.1)
SODIUM: 137 mmol/L (ref 135–145)

## 2015-10-23 LAB — ECHOCARDIOGRAM COMPLETE
HEIGHTINCHES: 70 in
WEIGHTICAEL: 3950.4 [oz_av]

## 2015-10-23 MED ORDER — TRAMADOL HCL 50 MG PO TABS
50.0000 mg | ORAL_TABLET | Freq: Four times a day (QID) | ORAL | Status: DC
Start: 2015-10-23 — End: 2015-10-24
  Administered 2015-10-23 – 2015-10-24 (×4): 50 mg via ORAL
  Filled 2015-10-23 (×4): qty 1

## 2015-10-23 MED ORDER — NICOTINE 14 MG/24HR TD PT24
14.0000 mg | MEDICATED_PATCH | Freq: Every day | TRANSDERMAL | Status: DC
  Administered 2015-10-23 – 2015-10-24 (×2): 14 mg via TRANSDERMAL
  Filled 2015-10-23 (×2): qty 1

## 2015-10-23 MED ORDER — ASPIRIN 81 MG PO CHEW
81.0000 mg | CHEWABLE_TABLET | Freq: Every day | ORAL | Status: DC
  Administered 2015-10-24: 81 mg via ORAL
  Filled 2015-10-23: qty 1

## 2015-10-23 MED ORDER — ATORVASTATIN CALCIUM 40 MG PO TABS
40.0000 mg | ORAL_TABLET | Freq: Every day | ORAL | Status: DC
  Administered 2015-10-23: 40 mg via ORAL
  Filled 2015-10-23: qty 1

## 2015-10-23 NOTE — Evaluation (Signed)
Occupational Therapy Evaluation Patient Details Name: Lance Duncan MRN: [id number] DOB: [birth year] Today's Date: 10/23/2015    History of Present Illness Lance Duncan is a 65 year old male with a past medical history significant for HTN, T2DM, and chronic low back pain who presented to the ED due to a several hour history of left sided weakness. He states that he was having trouble sleeping early this morning due to his back pain and tried to sleep in his recliner. Around 4 am, he noticed that his left side was numb and he was unable to move his left arm. When he tried to stand from his recliner to use the restroom, he could barely walk due to weakness of his left leg. He was able to make it to the restroom, but fell headfirst into the bathtub due to weakness. He did not hit his head or hurt himself. There was no one in the home to help him, so he crawled back the living room, which took "quite some time" because he was not able to move his left side. He was able to call a neighbor who brought him to the ED around 9 am. He has never had any symptoms of this nature in the past. He denies headaches, vision changes, loss of consciousness. He had no facial numbness or weakness, and his neighbor did not notice any facial changes or changes in his speech this morning. He did not have any chest pain, palpitations, or shortness of breath. Of note, the patient does state that he snores at night time. He had a sleep study for OSA over 10 years ago, but cannot remember the results. He did not wear a CPAP after the study.   In the ED vitals on arrival were: BP 191/92, Pulse 71, RR 23, O2 98%. EKG showed normal sinus rhythm with old anterior infarct, non contrast CT was negative for hemorrhage, CTA head showed no acute abnormalities. 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 MRI was ordered, but not performed due to penile implant.     Clinical  Impression   Patient presenting with decreased safety awareness, self care, balance, functional mobility/transfers, and decreased strength in L UE and LE. Patient reports being independent, living alone, and retired PTA. Patient currently functioning at supervision - steady assist. Patient will benefit from acute OT to increase overall independence in the areas of ADLs, functional mobility, IADLs, and safety awareness in order to safely discharge home.     Follow Up Recommendations  CIR;Supervision/Assistance - 24 hour    Equipment Recommendations  Tub/shower bench    Recommendations for Other Services       Precautions / Restrictions Precautions Precautions: Fall      Mobility Bed Mobility Overal bed mobility: Needs Assistance Bed Mobility: Sit to Supine       Sit to supine: Supervision      Transfers   Equipment used: None             General transfer comment: supervision    Balance Overall balance assessment: Needs assistance         Standing balance support: During functional activity;No upper extremity supported Standing balance-Leahy Scale: Fair Standing balance comment: supervision with steady assist for LB clothing management            ADL Overall ADL's : Needs assistance/impaired     Grooming: Set up;Standing Grooming Details (indicate cue type and reason): assist to open packages, supervision  for balance Upper Body Bathing: Set up;Supervision/ safety;Standing   Lower Body Bathing: Min guard;Sit to/from stand   Upper Body Dressing : Set up;Supervision/safety;Standing   Lower Body Dressing: Min guard;Sit to/from stand       Toileting- Architect and Hygiene: Supervision/safety         General ADL Comments: Pt ambulated short distances within the room without use of AD and close supervision for safety. Pt requesting to "wash up" and change clothing this session. Pt seated on commode chair to don and doff underwear and socks  with steady assist. Pt then standing at sink for grooming and UB self care tasks. Pt standing for 15 minutes to complete task. Lance Duncan to use L UE to wash UB as well with pt needing increased time to complete task. Pt having difficulty opening tooth brush package and needing assistance. Pt retunned to bed at end of session with call bell and all needed items within reach upon exiting the room.                Pertinent Vitals/Pain Pain Assessment: 0-10 Pain Score: 7  Pain Location: low back pain Pain Descriptors / Indicators: Sore Pain Intervention(s): Monitored during session;Repositioned     Hand Dominance Right   Extremity/Trunk Assessment Upper Extremity Assessment Upper Extremity Assessment: LUE deficits/detail LUE Deficits / Details: grossly 4-/5, tricep 3+/5   Lower Extremity Assessment Lower Extremity Assessment: LLE deficits/detail LLE Deficits / Details: grossly 4-/5   Cervical / Trunk Assessment Cervical / Trunk Assessment: Normal   Communication Communication Communication: No difficulties   Cognition Arousal/Alertness: Awake/alert Behavior During Therapy: WFL for tasks assessed/performed Overall Cognitive Status: Within Functional Limits for tasks assessed                                Home Living Family/patient expects to be discharged to:: Private residence Living Arrangements: Alone Available Help at Discharge: Friend(s);Available PRN/intermittently Type of Home: House Home Access: Stairs to enter [employer] of Steps: 3 Entrance Stairs-Rails: Can reach both Home Layout: One level     Bathroom Shower/Tub: Tub/shower unit;Curtain Shower/tub characteristics: Curtain   Bathroom Accessibility: Yes How Accessible: Accessible via walker Home Equipment: Grab bars - tub/shower;Cane - single point          Prior Functioning/Environment Level of Independence: Independent             OT Diagnosis: Generalized  weakness;Acute pain   OT Problem List: Decreased strength;Decreased activity tolerance;Impaired balance (sitting and/or standing);Decreased safety awareness;Pain   OT Treatment/Interventions: Self-care/ADL training;Therapeutic exercise;Therapeutic activities;Neuromuscular education;Energy conservation;DME and/or AE instruction;Patient/family education;Balance training    OT Goals(Current goals can be found in the care plan section) Acute Rehab OT Goals Patient Stated Goal: to go home OT Goal Formulation: With patient Time For Goal Achievement: 11/06/15 Potential to Achieve Goals: Good ADL Goals Pt Will Perform Grooming: with modified independence;standing Pt Will Perform Upper Body Bathing: with modified independence Pt Will Perform Lower Body Bathing: with modified independence Pt Will Perform Upper Body Dressing: with modified independence Pt Will Perform Lower Body Dressing: with modified independence Pt Will Transfer to Toilet: with modified independence Pt Will Perform Toileting - Clothing Manipulation and hygiene: with modified independence Pt Will Perform Tub/Shower Transfer: with supervision;ambulating;grab bars;rolling walker  OT Frequency: Min 2X/week   Barriers to D/C:    Pt lives alone and does not have 24/7 supervision          End  of Session Equipment Utilized During Treatment: Other (comment) (none) Nurse Communication: Mobility status  Activity Tolerance: Patient tolerated treatment well Patient left: in bed   Time: 1020-1049 OT Time Calculation (min): 29 min Charges:  OT General Charges $OT Visit: 1 Procedure OT Evaluation $OT Eval Moderate Complexity: 1 Procedure OT Treatments $Self Care/Home Management : 8-22 mins G-Codes: OT G-codes **NOT FOR INPATIENT CLASS** Functional Assessment Tool Used: clinical judgment Functional Limitation: Self care Self Care Current Status ([id number]): At least 20 percent but less than 40 percent impaired, limited or  restricted Self Care Goal Status (U0454(G8988): At least 1 percent but less than 20 percent impaired, limited or restricted  Lowella Gripittman, Rondrick Barreira L, MS, OTR/L 10/23/2015, 11:23 AM

## 2015-10-23 NOTE — Evaluation (Signed)
Physical Therapy Evaluation Patient Details Name: Lance PandyRobert Bohne MRN: [id number] DOB: 06-27-1951 Today's Date: 10/23/2015   History of Present Illness  Lance PandyRobert Danko is a 65 year old male with a past medical history significant for HTN, T2DM, and chronic low back pain who presented to the ED due to a several hour history of left sided weakness. He states that he was having trouble sleeping early this morning due to his back pain and tried to sleep in his recliner. Around 4 am, he noticed that his left side was numb and he was unable to move his left arm. When he tried to stand from his recliner to use the restroom, he could barely walk due to weakness of his left leg. He was able to make it to the restroom, but fell headfirst into the bathtub due to weakness.  Clinical Impression  Pt was indep PTA and was able to drive and care for self. Pt now with L sided weakness and impaired balance indicated by score of 16 on DGI and is at increased falls risk. Pt is an excellent candidate for CIR as pt demo'd excellent rehab potential to achieve safe mod I level of function during CIR stay and would be able to return to living home alone safely.    Follow Up Recommendations CIR    Equipment Recommendations   (TBD)    Recommendations for Other Services Rehab consult     Precautions / Restrictions Precautions Precautions: Fall Restrictions Weight Bearing Restrictions: No      Mobility  Bed Mobility Overal bed mobility: Needs Assistance Bed Mobility: Sit to Supine       Sit to supine: Supervision   General bed mobility comments: pt received sitting in chair  Transfers Overall transfer level: Needs assistance Equipment used: None Transfers: Sit to/from Stand Sit to Stand: Supervision         General transfer comment: pt demo'd safe technique, increased time but used UEs to push up  Ambulation/Gait Ambulation/Gait assistance: Min guard Ambulation Distance (Feet): 120  Feet Assistive device: 1 person hand held assist Gait Pattern/deviations: Step-through pattern;Decreased stride length Gait velocity: decreased Gait velocity interpretation: Below normal speed for age/gender General Gait Details: pt unsteady with lateral staggering L/R, L LE instability. pt with freq near cross over gait with minimal L foot clearance from floor  Stairs Stairs: Yes Stairs assistance: Min guard Stair Management: One rail Right Number of Stairs: 3 General stair comments: step to   Wheelchair Mobility    Modified Rankin (Stroke Patients Only) Modified Rankin (Stroke Patients Only) Pre-Morbid Rankin Score: No symptoms Modified Rankin: Moderate disability     Balance Overall balance assessment: Needs assistance         Standing balance support: During functional activity;No upper extremity supported Standing balance-Leahy Scale: Fair Standing balance comment: supervision with steady assist for LB clothing management                 Standardized Balance Assessment Standardized Balance Assessment : Dynamic Gait Index   Dynamic Gait Index Level Surface: Mild Impairment Change in Gait Speed: Mild Impairment Gait with Horizontal Head Turns: Mild Impairment Gait with Vertical Head Turns: Mild Impairment Gait and Pivot Turn: Mild Impairment Step Over Obstacle: Mild Impairment Step Around Obstacles: Mild Impairment Steps: Mild Impairment Total Score: 16       Pertinent Vitals/Pain Pain Assessment: 0-10 Pain Score: 7  Pain Location: low back Pain Descriptors / Indicators: Sore Pain Intervention(s): Monitored during session    Home Living  Family/patient expects to be discharged to:: Private residence Living Arrangements: Alone Available Help at Discharge: Friend(s);Available PRN/intermittently Type of Home: House Home Access: Stairs to enter Entrance Stairs-Rails: Can reach both Entrance Stairs-Number of Steps: 3 Home Layout: One level Home  Equipment: Grab bars - tub/shower;Cane - single point      Prior Function Level of Independence: Independent               Hand Dominance   Dominant Hand: Right    Extremity/Trunk Assessment   Upper Extremity Assessment: LUE deficits/detail       LUE Deficits / Details: grossly 4-/5, tricep 3+/5   Lower Extremity Assessment: LLE deficits/detail   LLE Deficits / Details: grossly 4-/5, noted instability with amb  Cervical / Trunk Assessment: Normal  Communication   Communication: No difficulties  Cognition Arousal/Alertness: Awake/alert Behavior During Therapy: WFL for tasks assessed/performed Overall Cognitive Status: Within Functional Limits for tasks assessed                      General Comments      Exercises        Assessment/Plan    PT Assessment Patient needs continued PT services  PT Diagnosis Difficulty walking   PT Problem List Decreased strength;Decreased activity tolerance;Decreased balance;Decreased mobility;Decreased safety awareness  PT Treatment Interventions DME instruction;Gait training;Stair training;Functional mobility training;Therapeutic activities;Therapeutic exercise   PT Goals (Current goals can be found in the Care Plan section) Acute Rehab PT Goals Patient Stated Goal: to go home PT Goal Formulation: With patient Time For Goal Achievement: 10/30/15 Potential to Achieve Goals: Good    Frequency Min 4X/week   Barriers to discharge Decreased caregiver support lives alone, has PRN assist from friends and neighbors    Co-evaluation               End of Session Equipment Utilized During Treatment: Gait belt Activity Tolerance: No increased pain Patient left: in chair;with call bell/phone within reach;with chair alarm set (with OT to complete bath) Nurse Communication: Mobility status    Functional Assessment Tool Used: clinical judgement Functional Limitation: Mobility: Walking and moving around Mobility:  Walking and Moving Around Current Status [phone]): At least 1 percent but less than 20 percent impaired, limited or restricted Mobility: Walking and Moving Around Goal Status [phone]): At least 1 percent but less than 20 percent impaired, limited or restricted    Time: 1004-1020 PT Time Calculation (min) (ACUTE ONLY): 16 min   Charges:   PT Evaluation $PT Eval Moderate Complexity: 1 Procedure     PT G Codes:   PT G-Codes **NOT FOR INPATIENT CLASS** Functional Assessment Tool Used: clinical judgement Functional Limitation: Mobility: Walking and moving around Mobility: Walking and Moving Around Current Status ([id number]): At least 1 percent but less than 20 percent impaired, limited or restricted Mobility: Walking and Moving Around Goal Status [phone]): At least 1 percent but less than 20 percent impaired, limited or restricted    Marcene Brawn 10/23/2015, 12:10 PM   Lewis Shock, PT, DPT Pager #: [phone] Office #: [phone]

## 2015-10-23 NOTE — Progress Notes (Signed)
BG at 78 this AM. Glucometer malfunction. SSI held at this time.

## 2015-10-23 NOTE — Progress Notes (Signed)
Rehab Admissions Coordinator Note:  Patient was screened by Clois DupesBoyette, Vesper Trant Godwin for appropriateness for an Inpatient Acute Rehab Consult per OT recommendation. Noted pt is supervision to steady assist overall with OT. I await PT evaluation before determining rehab venue needs. [place] [facility], Pt would be self pay for INpt rehab admission. I will follow up after PT eval.  Clois DupesBoyette, Cathye Kreiter Godwin 10/23/2015, 12:11 PM  I can be reached at [phone].

## 2015-10-23 NOTE — Progress Notes (Signed)
STROKE TEAM PROGRESS NOTE   HISTORY OF PRESENT ILLNESS Lance Duncan is an 65 y.o. male patient who was brought into the emergency room , with left sided weakness symptoms, onset at 4 am 10/22/15. He arrived at 9.15 am in ER. Denies any vision sx, no new numbness. He was up all night due to insomnia. Patient lives alone. He does not have any immediate family in the area. NIHSS 3. Patient was not administered IV t-PA secondary to being outside the time window. He was admitted for further evaluation and treatment.   SUBJECTIVE (INTERVAL HISTORY) No family is at the bedside.  Overall he feels his condition is completely resolved, however facial weakness and left side weakness remains on exam. He is sad to realize this.    OBJECTIVE Temp:  [98.1 F (36.7 C)-98.9 F (37.2 C)] 98.8 F (37.1 C) (04/18 0539) Pulse Rate:  [55-71] 56 (04/18 0539) Cardiac Rhythm:  [-] Normal sinus rhythm (04/17 1900) Resp:  [15-24] 18 (04/18 0539) BP: (130-211)/(67-113) 179/85 mmHg (04/18 0539) SpO2:  [97 %-100 %] 100 % (04/18 0539) Weight:  [111.993 kg (246 lb 14.4 oz)] 111.993 kg (246 lb 14.4 oz) (04/18 0539)  CBC:  Recent Labs Lab 10/22/15 0911 10/22/15 0913 10/23/15 0534  WBC 14.1*  --  6.6  NEUTROABS 10.1*  --   --   HGB 13.8 15.6 13.0  HCT 41.1 46.0 39.8  MCV 78.9  --  78.8  PLT 218  --  194    Basic Metabolic Panel:  Recent Labs Lab 10/22/15 0911 10/22/15 0913 10/23/15 0534  NA 137 139 137  K 3.7 3.6 3.8  CL 106 105 104  CO2 21*  --  22  GLUCOSE 75 71 70  BUN CREATININE 1.51* 1.50* 1.52*  CALCIUM 8.9  --  8.5*    Lipid Panel:    Component Value Date/Time   CHOL 195 10/23/2015 0534   TRIG 199* 10/23/2015 0534   HDL 25* 10/23/2015 0534   CHOLHDL 7.8 10/23/2015 0534   VLDL 40 10/23/2015 0534   LDLCALC 130* 10/23/2015 0534   HgbA1c: No results found for: HGBA1C Urine Drug Screen:    Component Value Date/Time   LABOPIA NONE DETECTED 10/22/2015 1100   COCAINSCRNUR  NONE DETECTED 10/22/2015 1100   LABBENZ NONE DETECTED 10/22/2015 1100   AMPHETMU NONE DETECTED 10/22/2015 1100   THCU NONE DETECTED 10/22/2015 1100   LABBARB NONE DETECTED 10/22/2015 1100      IMAGING  Ct Head Wo Contrast 10/22/2015  1. No acute intracranial abnormality. 2. Mild generalized atrophy and white matter disease is slightly advanced for age. This likely reflects the sequela of chronic microvascular ischemia.   CT HEAD  10/22/2015  No intracranial hemorrhage or CT evidence of large acute infarct. Small vessel disease changes. Global atrophy without hydrocephalus.   CTA NECK  10/22/2015  Mild to slightly moderate narrowing proximal left subclavian artery. Calcified plaque right carotid bifurcation with 48% diameter stenosis distal right common carotid artery. Calcified plaque proximal right internal carotid artery with less than 50% diameter stenosis. 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 This is flow limiting as the left internal carotid artery beyond this region is narrowed. Left vertebral artery is slightly dominant size. Mild irregularity of the cervical segment of the vertebral arteries bilaterally. Dental disease. 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 Chronic lung  changes possibly with superimposed pulmonary vascular prominence.   CTA HEAD  10/22/2015  Anterior circulation without medium or large size vessel significant stenosis or occlusion. Middle cerebral artery branch vessel narrowing and irregularity bilaterally. Plaque internal carotid artery cavernous segment without high-grade stenosis. There may be 2 tiny (1 mm) aneurysms of the right internal carotid artery cavernous segment. Fetal type contribution to the posterior cerebral arteries. Irregular narrowed distal vertebral arteries and basilar artery. Irregular  narrowed posterior inferior cerebellar arteries, anterior inferior cerebral arteries and superior cerebellar arteries.   Cerebral angiogram 10/22/2015 1. 95 % plus pre occlusive stenosis of LT ICA prox. 2.Non opacification of LT ACA prox. 3.Approx 50 % stenosis of Lt ICA petrous Cavernous segment. 4.approx 50 % stenosis of LT VA prox   PHYSICAL EXAM Obese middle-aged African-American male not in distress . Marland Kitchen. Afebrile. Head is nontraumatic. Neck is supple without bruit.    Cardiac exam no murmur or gallop. Lungs are clear to auscultation. Distal pulses are well felt. Neurological Exam :  Awake alert oriented x 3 normal speech and language. Mild left lower face asymmetry. Tongue midline. No drift. Mild diminished fine finger movements on left. Orbits right over left upper extremity. Mild left grip weak.. Normal sensation . Normal coordination. ASSESSMENT/PLAN Lance Duncan is a 65 y.o. male with history of hypertension, diabetes and chronic back pain presenting with left sided weakness. He did not receive IV t-PA due to being outside the window.   Stroke:  Presume R brain infarct not seen on initial CT given ongoing L hemiparesis, source unclear at this time though suspect a small subcortical infarct. Workup underway  Resultant  L hemiparesis, L facial droop  MRI  Unable to be performed due to penile implant (need clearance prior to MRI)  CTA head  No significant stenosis. Diffused atherosclerosis. ? Tiny (1mm) R ICA aneurysm  CTA neck  L SCA narrowing. R CCA 48%. R ICA < 50%. L CCA with string sign. B cervical irregularity. Dental disease. Cervical disease C3-4. Goiter with L substernal extension. Chronic lung changes.  Cerebral angio L CCA 95% stenosis (string sign), L petrous 50% stenosis. L VA 50% stenosis  Recommend repeat CT after 24h to confirm/refute stroke  2D Echo  pending   LDL 130  HgbA1c pending  Lovenox 40 mg sq daily for VTE prophylaxis  Diet heart  healthy/carb modified Room service appropriate?: Yes; Fluid consistency:: Thin  No antithrombotic prior to admission, given large vessel disease was placed on aspirin 325 mg daily and clopidogrel 75 mg daily. For now, recommend dual antiplatelets x 3 months then plavix alone - defer to VVS   Patient counseled to be compliant with his antithrombotic medications  Ongoing aggressive stroke risk factor management  Therapy recommendations:  CIR  Advised to exercise. Pool exercise may be appropriate given back pain  Disposition:  pending   [facility]  Carotid stenosis  L ICA string sign  Incidental finding  Recommend VVS consult for surgery in the future  Hypertension  elevated Permissive hypertension (OK if < 220/120) but gradually normalize in 5-7 days Given carotid stenosis, would not lower aggressively  Hyperlipidemia  Home meds:  No statin  LDL 130, goal < 70  Add statin   Diabetes type II  HgbA1c pending, goal < 7.0  Hypoglycemia during the night, 44, treated with OJ  Other Stroke Risk Factors  Cigarette smoker, advised to stop smoking  Occasional ETOH use  Obesity, Body mass index is 35.43  kg/(m^2).   Family hx stroke (mother, brother)  Possible Obstructive sleep apnea (study 10 years ago), snores, does not use CPAP at home  Other Active Problems  AKI vs CKD  Has penile implant  On D5NS at 75/h - in general, do not recommend use of dextrose containing IVF in acute stroke patients.  Dr. Pearlean Brownie called and discussed case with Medical resident. Dr [facility] day # 0  Rhoderick Moody [facility] See Amion for Pager information 10/23/2015 12:12 PM  I have personally examined this patient, reviewed notes, independently viewed imaging studies, participated in medical decision making and plan of care. I have made any additions or clarifications directly to the above note. Agree with note above.  He presented with left-sided  weakness likely due to a right brain TIA. He remains at risk for recurrent stroke, TIA, neurological worsening and needs ongoing stroke evaluation. He also had incidental very tight left extracranial carotid stenosis and needs vascular surgery consult for elective revascularization for stroke prevention. I spoke to the medical resident and answered questions Delia Heady, MD Medical Director [facility] Pager: [phone] 10/23/2015 3:46 PM    To contact Stroke Continuity provider, please refer to [email or web address]. After hours, contact General Neurology

## 2015-10-23 NOTE — Consult Note (Signed)
Hospital Consult  VASCULAR SURGERY ASSESSMENT AND PLAN:  I have examined the patient and interviewed the patient myself. I agree with the findings of the physician's assistant below.  This patient awoke yesterday morning with left upper extremity and subsequently left lower extremity weakness which lasted a good part of the day. His symptoms at this point have essentially resolved. He denies any previous history of stroke, TIAs, expressive or receptive aphasia, or amaurosis fugax. He was not on aspirin at the time. He is now on aspirin (81 mg) and Plavix.  On exam he does have a left carotid bruit. He has some very mild left upper extremity weakness.  CEREBRAL ANGIOGRAM: I have reviewed his cerebral angiogram with Dr. Corliss Skains. He has a smooth 50% right carotid stenosis. He has a 80% left carotid stenosis. The CT angiogram of the neck had suggested a tighter stenosis on the left and possible string sign but this was not confirmed by cerebral arteriography.  * He has a 50% fairly smooth right carotid stenosis. This does not appear to be significantly ulcerated. Therefore, it is not clear if this is responsible for his right hemispheric TIA.   * He does have an 80% left carotid stenosis that is asymptomatic and I think that he should be considered for elective repair of this.  * His echo results are pending.  * I have ordered a carotid duplex.  * We have discussed the importance of tobacco cessation.  * He does admit to some occasional chest pain but thinks it might be indigestion. Given that he will likely require elective carotid endarterectomy on the left I think it would be worth having cardiology evaluate the patient preoperatively.  Waverly Ferrari, MD, FACS Beeper [phone] Office: [phone]   Reason for Consult:  Carotid artery stenosis  Referring Physician:  IM Jacqulyn Bath, MD student) MRN #:  [id number]  History of Present Illness: This is a 65 y.o. male who states that he was  asleep when his lower back pain woke him up.  He states he went to reach for the remote control and his left hand wound not work and had to use his right hand.  He states that he had weakness in his left leg also.  He states this was transient and started getting use back after about 30 minutes.  He presented to the hospital.  He states that his arm and leg are back to normal.  He states that he has not experienced these symptoms in the past.  He does state that about a year ago, he experienced weakness in his lower body and left [employer] and was feeling better.  He has not experienced these sx since then.  He does have lower back pain at L4-L5, which he takes Tramadol for the pain.   He states that both his parents have hx of stroke as well as siblings.    He does smoke ~ 0.5ppd and has since around 1970 when he was in the [employer].    He does have diabetes and is on insulin for this.  He is on an ACEI and CCB as well as HCTZ for blood pressure management.    He has a hx of diverticulitis and had a colectomy ~ 20 years ago for this.  He has not had any trouble with this since.  IR was called to perform a cerebral angiogram by Dr. Corliss Skains on 10/22/15 with the following results:         Expand All Collapse All  Past Medical History  Diagnosis Date  . Hypertension   . Diverticula of small intestine   . Type II diabetes mellitus (HCC)   . Hepatitis C     "I'm cured now; tx ended 01/2015" (10/22/2015)  . Stroke (HCC) 10/22/2015    "left side is weaker now" (10/22/2015)  . Arthritis     "mild; hips" (10/22/2015)  . Chronic lower back pain     Past Surgical History  Procedure Laterality Date  . Penile prosthesis implant    . Testicle removal Right     "benign"  . Colonoscopy  1990s  . Tear duct probing Bilateral   . Variococele repair  ~ 1975    "tied up varicose veins in one of my groins"    No Known Allergies  Prior to Admission medications   Medication Sig Start Date  End Date Taking? Authorizing Provider  amLODipine (NORVASC) 10 MG tablet Take 10 mg by mouth daily.   Yes Historical Provider, MD  dorzolamide-timolol (COSOPT) 22.3-6.8 MG/ML ophthalmic solution Place 1 drop into both eyes 2 (two) times daily.   Yes Historical Provider, MD  hydrochlorothiazide (HYDRODIURIL) 25 MG tablet Take 25 mg by mouth daily.   Yes Historical Provider, MD  insulin glargine (LANTUS) 100 unit/mL SOPN Inject 66-74 Units into the skin at bedtime. Take 66 units before the first meal, and 74 units at bedtime   Yes Historical Provider, MD  latanoprost (XALATAN) 0.005 % ophthalmic solution Place 1 drop into both eyes at bedtime.   Yes Historical Provider, MD  lisinopril (PRINIVIL,ZESTRIL) 40 MG tablet Take 40 mg by mouth daily.   Yes Historical Provider, MD  traMADol (ULTRAM) 50 MG tablet Take 50-100 mg by mouth every 6 (six) hours as needed for severe pain.    Yes Historical Provider, MD    Social History   Social History  . Marital Status: Legally Separated    Spouse Name: N/A  . Number of Children: N/A  . Years of Education: N/A   Occupational History  . Not on file.   Social History Main Topics  . Smoking status: Current Every Day Smoker -- 0.50 packs/day for 46 years    Types: Cigarettes  . Smokeless tobacco: Never Used  . Alcohol Use: Yes     Comment: 10/22/2015 "I have a drink q 3-4 weeks"  . Drug Use: No  . Sexual Activity: Not Currently   Other Topics Concern  . Not on file   Social History Narrative  . No narrative on file   Family History  Problem Relation Age of Onset  . Stroke Mother   . Stroke Father   . Stroke Sister   . Stroke Brother   No hx of abdominal aortic aneurysms.   ROS: [x]  Positive   [ ]  Negative   [ ]  All sytems reviewed and are negative  Cardiovascular: [X]  chest pain/pressure []  palpitations []  SOB lying flat []  DOE []  pain in legs while walking []  pain in legs at rest []  pain in legs at night []  non-healing ulcers []   hx of DVT []  swelling in legs  Pulmonary: []  productive cough []  asthma/wheezing []  home O2  Neurologic: [x]  weakness in [x]  left arm [x]  left leg [x]  numbness in [x]  left arm [x]  left leg [x]  hx of CVA []  mini stroke [] difficulty speaking or slurred speech []  temporary loss of vision in one eye []  dizziness  Hematologic: []  hx of cancer []  bleeding problems []  problems with blood clotting easily  Endocrine:   [x]  diabetes []  thyroid disease  GI []  vomiting blood []  blood in stool [x]  hx diverticulitis with colectomy ~ 20 years ago [x]  hx Hepatitis C (treatment in 2016)  GU: []  CKD/renal failure []  HD--[]  M/W/F or []  T/T/S []  burning with urination []  blood in urine  Psychiatric: []  anxiety []  depression  Musculoskeletal: [x]  arthritis-low back pain []  joint pain  Integumentary: []  rashes []  ulcers  Constitutional: []  fever []  chills   Physical Examination  Filed Vitals:   10/23/15 0539 10/23/15 1007  BP: 179/85 180/65  Pulse: 56 65  Temp: 98.8 F (37.1 C) 97.7 F (36.5 C)  Resp: 18 20   Body mass index is 35.43 kg/(m^2).  General:  WDWN in NAD Gait: Not observed HENT: WNL, normocephalic Pulmonary: normal non-labored breathing, without Rales, rhonchi,  wheezing Cardiac: regular, without  Murmurs, rubs or gallops; with left carotid bruit Abdomen:  soft, NT/ND, no masses with well healed midline scar from umbilicus and below Skin: without rashes Vascular Exam/Pulses:  Right Left  Radial 2+ (normal) 2+ (normal)  Ulnar Unable to palpate  Unable to palpate   Femoral Pressure dressing in place 2+ (normal)  Popliteal Unable to palpate  Unable to palpate   DP 2+ (normal) 2+ (normal)  PT Unable to palpate  Unable to palpate    Extremities: without ischemic changes, without Gangrene , without cellulitis; without open wounds;  Musculoskeletal: no muscle wasting or atrophy  Neurologic: A&O X 3;  moving all extremities equally. Speech is  fluent/normal Psychiatric:  Normal affect Lymph:  No inguinal lymphadenopathy   CBC    Component Value Date/Time   WBC 6.6 10/23/2015 0534   RBC 5.05 10/23/2015 0534   HGB 13.0 10/23/2015 0534   HCT 39.8 10/23/2015 0534   PLT 194 10/23/2015 0534   MCV 78.8 10/23/2015 0534   MCH 25.7* 10/23/2015 0534   MCHC 32.7 10/23/2015 0534   RDW 14.6 10/23/2015 0534   LYMPHSABS 2.6 10/22/2015 0911   MONOABS 1.4* 10/22/2015 0911   EOSABS 0.0 10/22/2015 0911   BASOSABS 0.0 10/22/2015 0911    BMET    Component Value Date/Time   NA 137 10/23/2015 0534   K 3.8 10/23/2015 0534   CL 104 10/23/2015 0534   CO2 22 10/23/2015 0534   GLUCOSE 70 10/23/2015 0534   BUN 17 10/23/2015 0534   CREATININE 1.52* 10/23/2015 0534   CALCIUM 8.5* 10/23/2015 0534   GFRNONAA 47* 10/23/2015 0534   GFRAA 55* 10/23/2015 0534    COAGS: Lab Results  Component Value Date   INR 1.09 10/22/2015     Non-Invasive Vascular Imaging:   CTA neck 10/22/15: CTA NECK  Mild to slightly moderate narrowing proximal left subclavian artery.  Calcified plaque right carotid bifurcation with 48% diameter stenosis distal right common carotid artery.  Calcified plaque proximal right internal carotid artery with less than 50% diameter stenosis.  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 This is flow limiting as the left internal carotid artery beyond this region is narrowed.  Left vertebral artery is slightly dominant size. Mild irregularity of the cervical segment of the vertebral arteries bilaterally.  2D Echo pending results  Statin:  No. Beta Blocker:  No. Aspirin:  Yes.   ACEI:  Yes.   ARB:  No. Other antiplatelets/anticoagulants:  Yes.   Plavix and Lovenox started in the hospital.   ASSESSMENT/PLAN: This is a 65 y.o. male with recent  onset of left hemiparesis, which has improved since admission   -pt found to have  a 95% pre occlusive stenosis of teh left proximal ICA, however, this does not explain his left sided hemiparesis. -he was also found to have 50% stenosis of the right ICA petrous cavernous segment, which is not accessible surgically. -I had a discussion with the pt about smoking cessation. -he has an extensive family hx of stroke in his family.  Plavix/aspirin have been added to his medical regimen. -possible statin for cholesterol management, strict diabetes control and blood pressure management.  His creatinine is slightly elevated at 1.5, which has been stable here, but unable to determine Creatinine prior to admission.  ? Discontinue ACEI-will defer to primary team. -timing for left carotid endartercetomy will need to be determined by Dr. Edilia Bo who will see the pt this afternoon.   Doreatha Massed, PA-C [facility] [phone]

## 2015-10-23 NOTE — Progress Notes (Signed)
Subjective: Lance Duncan is doing better this morning in regard to his strength. PT will come to evaluate him today to assess his needs, but his strength on exam has improved. He did have trouble sleeping last night due to his chronic back pain and would like to receive his tramadol which he already takes at home to help with pain. Otherwise, we are waiting on neurology recommendations. Objective: Vital signs in last 24 hours: Filed Vitals:   10/23/15 0131 10/23/15 0330 10/23/15 0539 10/23/15 1007  BP: 169/82 167/73 179/85 180/65  Pulse: 59 58 56 65  Temp: 98.9 F (37.2 C) 98.6 F (37 C) 98.8 F (37.1 C) 97.7 F (36.5 C)  TempSrc: Oral Oral Oral Oral  Resp: Height:    (1.778 m)   Weight:   111.993 kg (246 lb 14.4 oz)   SpO2: 99% 100% 100% 100%   Weight change:   Intake/Output Summary (Last 24 hours) at 10/23/15 1032 Last data filed at 10/23/15 0837  Gross per 24 hour  Intake    960 ml  Output    750 ml  Net    210 ml   Blood pressure 188/65, pulse 65, temperature 97.7 F (36.5 C), resp. rate 20, SpO2 100 %. General: AA male, alert, cooperative, NAD. Neurologic: Alert & oriented x3, cranial nerves II-XII intact, sensation intact to light touch. 5/5 strength in bilateral upper and lower extremities.  HEENT: PERRL, EOMI. Moist mucus membranes Neck: Full range of motion without pain Lungs: Clear to ascultation bilaterally, normal work of respiration, no wheezes, rales, rhonchi Heart: RRR, no murmurs, gallops, or rubs Abdomen: Soft, non-tender, non-distended, BS + in all four quadrants Extremities: No cyanosis, clubbing, or edema  Micro Results: No results found for this or any previous visit (from the past 240 hour(s)). Studies/Results: Ct Angio Head W/cm &/or Wo Cm  10/22/2015  EXAM: CT ANGIOGRAPHY HEAD AND NECK TECHNIQUE: Multidetector CT imaging of the head and neck was performed using the standard protocol during bolus administration of intravenous  contrast. Multiplanar CT image reconstructions and MIPs were obtained to evaluate the vascular anatomy. Carotid stenosis measurements (when applicable) are obtained utilizing NASCET criteria, using the distal internal carotid diameter as the denominator. COMPARISON:  None. FINDINGS: CT HEAD Brain: No intracranial hemorrhage or CT evidence of large acute infarct. Small vessel disease changes. Limited for excluding small acute infarct given white matter changes. MR can be obtained for further delineation if clinically desired. No worrisome intracranial enhancing lesion. Global atrophy without hydrocephalus. Calvarium and skull base: Arachnoid granulations without destructive lesion. Paranasal sinuses: Dental disease with periapical lucency upper teeth without adjacent sinus disease. Mastoid air cells and middle ear cavities are clear. Orbits: Minimal exophthalmos. CTA NECK Aortic arch: Common origin innominate and left common carotid artery. Plaque aortic arch and origin of great vessels with mild slightly moderate narrowing proximal left subclavian artery. Right carotid system: Calcified plaque carotid bifurcation with 48% diameter stenosis distal right common carotid artery. Calcified plaque proximal right internal carotid artery with less than 50% diameter stenosis. 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 This is flow limiting as the left internal carotid artery beyond this region is narrowed. Vertebral arteries:Left vertebral artery is slightly dominant size. Mild irregularity of the cervical segment of the vertebral arteries bilaterally. Skeleton: Dental disease. 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 Other neck: Goiter with substernal extension  on the left. Mild asymmetry palatine tonsils/ piriform sinus without mass identified. Upper chest: Chronic lung changes possibly with  superimposed pulmonary vascular prominence. CTA HEAD Anterior circulation: No medium or large size vessel significant stenosis or occlusion. Middle cerebral artery branch vessel narrowing irregularity bilaterally. Plaque internal carotid artery cavernous segment without high-grade stenosis. There may be 2 tiny (1 mm) aneurysm of the right internal carotid artery cavernous segment. Fetal type contribution to the posterior cerebral arteries. Posterior circulation: Narrowed irregular distal vertebral arteries and basilar artery. Irregular narrowed posterior inferior cerebellar arteries, anterior inferior cerebral arteries and superior cerebellar arteries. Venous sinuses: Patent. Anatomic variants: As above. Delayed phase: As above. IMPRESSION: CT HEAD No intracranial hemorrhage or CT evidence of large acute infarct. Small vessel disease changes. Global atrophy without hydrocephalus. CTA NECK Mild to slightly moderate narrowing proximal left subclavian artery. Calcified plaque right carotid bifurcation with 48% diameter stenosis distal right common carotid artery. Calcified plaque proximal right internal carotid artery with less than 50% diameter stenosis. 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 This is flow limiting as the left internal carotid artery beyond this region is narrowed. Left vertebral artery is slightly dominant size. Mild irregularity of the cervical segment of the vertebral arteries bilaterally. Dental disease. 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 Chronic lung changes possibly with superimposed pulmonary vascular prominence. CTA HEAD Anterior circulation without medium or large size vessel significant stenosis or occlusion. Middle cerebral artery branch vessel narrowing and irregularity bilaterally. Plaque internal carotid artery cavernous  segment without high-grade stenosis. There may be 2 tiny (1 mm) aneurysms of the right internal carotid artery cavernous segment. Fetal type contribution to the posterior cerebral arteries. Irregular narrowed distal vertebral arteries and basilar artery. Irregular narrowed posterior inferior cerebellar arteries, anterior inferior cerebral arteries and superior cerebellar arteries. These results were called by telephone at the time of interpretation on 10/22/2015 at 9:42 am to Dr. Lavon Paganini who verbally acknowledged these results. Electronically Signed   By: Lacy Duverney M.D.   On: 10/22/2015 10:22   Ct Head Wo Contrast  10/22/2015  CLINICAL DATA:  Code stroke.  New onset left-sided weakness. EXAM: CT HEAD WITHOUT CONTRAST TECHNIQUE: Contiguous axial images were obtained from the base of the skull through the vertex without intravenous contrast. COMPARISON:  None. FINDINGS: No acute infarct, hemorrhage, or mass lesion is present. Mild generalized atrophy and scattered subcortical white matter hypoattenuation is advanced for age. The cortex is intact. The basal ganglia are intact. The insular ribbon is within normal limits. The brainstem and cerebellum are unremarkable. No significant extra-axial fluid collection is present. Atherosclerotic changes are present within the cavernous internal carotid arteries bilaterally. The paranasal sinuses and mastoid air cells are clear. The calvarium is intact. No significant extracranial soft tissue lesions are present. The globes and orbits are intact. ASPECTS score = 10/10 [place] Stroke Program Early CT Score Normal score = 10 IMPRESSION: 1. No acute intracranial abnormality. 2. Mild generalized atrophy and white matter disease is slightly advanced for age. This likely reflects the sequela of chronic microvascular ischemia. These results were called by telephone at the time of interpretation on 10/22/2015 at 9:22 am to Dr. Lavon Paganini , who verbally acknowledged these results.  Electronically Signed   By: Marin Roberts M.D.   On: 10/22/2015 09:24   Ct Angio Neck W/cm &/or Wo/cm  10/22/2015  EXAM: CT ANGIOGRAPHY HEAD  AND NECK TECHNIQUE: Multidetector CT imaging of the head and neck was performed using the standard protocol during bolus administration of intravenous contrast. Multiplanar CT image reconstructions and MIPs were obtained to evaluate the vascular anatomy. Carotid stenosis measurements (when applicable) are obtained utilizing NASCET criteria, using the distal internal carotid diameter as the denominator. COMPARISON:  None. FINDINGS: CT HEAD Brain: No intracranial hemorrhage or CT evidence of large acute infarct. Small vessel disease changes. Limited for excluding small acute infarct given white matter changes. MR can be obtained for further delineation if clinically desired. No worrisome intracranial enhancing lesion. Global atrophy without hydrocephalus. Calvarium and skull base: Arachnoid granulations without destructive lesion. Paranasal sinuses: Dental disease with periapical lucency upper teeth without adjacent sinus disease. Mastoid air cells and middle ear cavities are clear. Orbits: Minimal exophthalmos. CTA NECK Aortic arch: Common origin innominate and left common carotid artery. Plaque aortic arch and origin of great vessels with mild slightly moderate narrowing proximal left subclavian artery. Right carotid system: Calcified plaque carotid bifurcation with 48% diameter stenosis distal right common carotid artery. Calcified plaque proximal right internal carotid artery with less than 50% diameter stenosis. 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 This is flow limiting as the left internal carotid artery beyond this region is narrowed. Vertebral arteries:Left vertebral artery is slightly dominant size. Mild irregularity of the cervical segment of the  vertebral arteries bilaterally. Skeleton: Dental disease. 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 Mild asymmetry palatine tonsils/ piriform sinus without mass identified. Upper chest: Chronic lung changes possibly with superimposed pulmonary vascular prominence. CTA HEAD Anterior circulation: No medium or large size vessel significant stenosis or occlusion. Middle cerebral artery branch vessel narrowing irregularity bilaterally. Plaque internal carotid artery cavernous segment without high-grade stenosis. There may be 2 tiny (1 mm) aneurysm of the right internal carotid artery cavernous segment. Fetal type contribution to the posterior cerebral arteries. Posterior circulation: Narrowed irregular distal vertebral arteries and basilar artery. Irregular narrowed posterior inferior cerebellar arteries, anterior inferior cerebral arteries and superior cerebellar arteries. Venous sinuses: Patent. Anatomic variants: As above. Delayed phase: As above. IMPRESSION: CT HEAD No intracranial hemorrhage or CT evidence of large acute infarct. Small vessel disease changes. Global atrophy without hydrocephalus. CTA NECK Mild to slightly moderate narrowing proximal left subclavian artery. Calcified plaque right carotid bifurcation with 48% diameter stenosis distal right common carotid artery. Calcified plaque proximal right internal carotid artery with less than 50% diameter stenosis. 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 This is flow limiting as the left internal carotid artery beyond this region is narrowed. Left vertebral artery is slightly dominant size. Mild irregularity of the cervical segment of the vertebral arteries bilaterally. Dental disease. 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 Goiter with substernal extension on the  left. Chronic lung changes possibly with superimposed pulmonary vascular prominence. CTA HEAD Anterior circulation without medium or large size vessel significant stenosis or occlusion. Middle cerebral artery branch vessel narrowing and irregularity bilaterally. Plaque internal carotid artery cavernous segment without high-grade stenosis. There may be 2 tiny (1 mm) aneurysms of the right internal carotid artery cavernous segment. Fetal type contribution to the posterior cerebral arteries. Irregular narrowed distal vertebral arteries and basilar artery. Irregular narrowed posterior inferior cerebellar arteries, anterior inferior cerebral arteries and superior cerebellar arteries.  These results were called by telephone at the time of interpretation on 10/22/2015 at 9:42 am to Dr. Lavon Paganini who verbally acknowledged these results. Electronically Signed   By: Lacy Duverney M.D.   On: 10/22/2015 10:22   Medications: I have reviewed the patient's current medications. Scheduled Meds: . aspirin  325 mg Oral Daily  . clopidogrel  75 mg Oral Daily  . enoxaparin (LOVENOX) injection  40 mg Subcutaneous Q24H  . insulin aspart  0-5 Units Subcutaneous QHS  . insulin aspart  0-9 Units Subcutaneous TID WC  . insulin glargine  20 Units Subcutaneous QHS  . sodium chloride flush  3 mL Intravenous Q12H  . traMADol  50-100 mg Oral 4 times per day   Continuous Infusions: . sodium chloride    . dextrose 5 % and 0.9% NaCl 1,000 mL infusion 75 mL/hr at 10/22/15 2000   PRN Meds:.labetalol Assessment/Plan: Active Problems:   Weakness   Stroke [facility])   TIA (transient ischemic attack)  Lance Duncan is a 65 year old male with a past medical history significant for HTN, T2DM, and chronic low back pain who presented to the ED due to a several hour history of left sided weakness, likely due to TIA given transient nature of symptoms and no infarcts on imaging.  TIA: Weakness has almost completely resolved. Presented with  left sided weakness this yesterday with MSK exam in the ED with 3/5 weakness in left upper and lower extremity with no other neurological deficits. His weakness improved throughout the day and now is strength is 5/5 in the bilateral upper and lower extremities on exam. Non contrast CT showed no evidence of hemorrhage, and CTA head showed no acute abnormalities. 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 MRI was ordered, but not performed due to penile implant. This is likely a TIA given the transient nature of symptoms. He does have significant family history of cardiovascular and cerebrovascular disease. He has T2DM that he states is well controlled and has HTN treated with lisinopril, HCTZ, and amlodipine.  -IR performed cerebral angiogram to further evaluate which showed 95% stenosis of the left proximal ICA.  -Consult to vascular surgery for left carotid revascularization -ECHO pending -Lipid panel shows LDL of 130, indicated need for statin for secondary prevention given recent TIA -A1c in in process -Asprin 325 mg switched to 81 mg today, keeping plavix 75 mg per neuro -PT/OT to eval -Patient would like to quite smoking, nicotine patch ordered  HTN: BP elevated on admission. Takes amlodipine 10 mg, lisinopril 40 mg, HCTZ 25 mg daily at home for HTN -Hold home BP medications for permissive HTN -Give Labetalol 5 mg IV q2h prn for SBP >200, DBP >110 -Has not needed bp meds since admission  DM type II: Uses only Lantus at home, says he uses 30 units daily. Had episode of hypoglycemia while NPO -Hypoglycemia protocol. Give D5 NS @ 75 cc/hr while NPO -Restart Lantus 20 units + ISS-S + HS coverage when not NPO for procedure -A1C is pending, if not well controlled at 7 or less, should consider augmenting regimen as an outpatient  AKI vs CKD: Cr 1.51 on admission. Unclear what his baseline is.   Chronic low back pain: Has had low back  pain for years and uses tramadol 50 mg q6h at home -continue tramadol q6h prn for pain  DVT/PE PPx: Lovenox [place]  Dispo:  Vasc Surg consult today, finish stroke work-up, OT is  recommending CIR, awaiting PT recommendations likely discharge to rehab or d/c home tomorrow after stroke work-up complete.  This is a Psychologist, occupationalMedical Student Note.  The care of the patient was discussed with Dr. Heide SparkNarendra and the assessment and plan formulated with their assistance.  Please see their attached note for official documentation of the daily encounter.     Lance Almiffany D Fines Duncan, Med Student 10/23/2015, 10:32 AM

## 2015-10-23 NOTE — Care Management Note (Signed)
Case Management Note  Patient Details  Name: Lance Duncan MRN: [id number] Date of Birth: 1950/07/16  Subjective/Objective:                    Action/Plan: Patient presented with left-sided weakness.  Lives at home alone. Will follow for discharge needs pending PT/OT evals and physician orders.  Expected Discharge Date:                  Expected Discharge Plan:     In-House Referral:     Discharge planning Services     Post Acute Care Choice:    Choice offered to:     DME Arranged:    DME Agency:     HH Arranged:    HH Agency:     Status of Service:  In process, will continue to follow  Medicare Important Message Given:    Date Medicare IM Given:    Medicare IM give by:    Date Additional Medicare IM Given:    Additional Medicare Important Message give by:     If discussed at Long Length of Stay Meetings, dates discussed:    Additional CommentsAnda Kraft:  Tashona Calk C, RN 10/23/2015, 11:22 AM [phone]

## 2015-10-23 NOTE — Progress Notes (Signed)
Subjective: This morning, he acknowledged feeling better with regards to his strength. He was unhappy about not receiving home Tramadol overnight for which we apologized.  Objective: Vital signs in last 24 hours: Filed Vitals:   10/23/15 0131 10/23/15 0330 10/23/15 0539 10/23/15 1007  BP: 169/82 167/73 179/85 180/65  Pulse: 59 58 56 65  Temp: 98.9 F (37.2 C) 98.6 F (37 C) 98.8 F (37.1 C) 97.7 F (36.5 C)  TempSrc: Oral Oral Oral Oral  Resp: Height:    (1.778 m)   Weight:   246 lb 14.4 oz (111.993 kg)   SpO2: 99% 100% 100% 100%   Weight change:   Intake/Output Summary (Last 24 hours) at 10/23/15 1156 Last data filed at 10/23/15 1144  Gross per 24 hour  Intake   1810 ml  Output    750 ml  Net   1060 ml   General: middle-aged African American male, sitting up in chair, no acute distress HEENT: PERRL, EOMI, no scleral icterus, oropharynx clear Cardiac: RRR, no rubs, murmurs or gallops Pulm: clear to auscultation bilaterally in the anterior lung fields, no wheezes, rales, or rhonchi Abd: soft, nontender, nondistended, BS present Ext: warm and well perfused, no pedal edema Neuro: responds to questions appropriately; moving all extremities freely, 5/5 upper and lower extremity strength, alert and oriented x 3  Lab Results: Basic Metabolic Panel:  Recent Labs Lab 10/22/15 0911 10/22/15 0913 10/23/15 0534  NA 137 139 137  K 3.7 3.6 3.8  CL 106 105 104  CO2 21*  --  22  GLUCOSE 75 71 70  BUN CREATININE 1.51* 1.50* 1.52*  CALCIUM 8.9  --  8.5*   Liver Function Tests:  Recent Labs Lab 10/22/15 0911  AST 16  ALT 10*  ALKPHOS 81  BILITOT 0.5  PROT 7.2  ALBUMIN 3.2*   CBC:  Recent Labs Lab 10/22/15 0911 10/22/15 0913 10/23/15 0534  WBC 14.1*  --  6.6  NEUTROABS 10.1*  --   --   HGB 13.8 15.6 13.0  HCT 41.1 46.0 39.8  MCV 78.9  --  78.8  PLT 218  --  194   CBG:  Recent Labs Lab 10/22/15 1701 10/22/15 1741  10/22/15 1833 10/22/15 1907 10/22/15 2143 10/23/15 1145  GLUCAP 53* 84 44* 62* 147* 154*   Fasting Lipid Panel:  Recent Labs Lab 10/23/15 0534  CHOL 195  HDL 25*  LDLCALC 130*  TRIG 199*  CHOLHDL 7.8   Coagulation:  Recent Labs Lab 10/22/15 0911  LABPROT 14.3  INR 1.09   Urine Drug Screen: Drugs of Abuse     Component Value Date/Time   LABOPIA NONE DETECTED 10/22/2015 1100   COCAINSCRNUR NONE DETECTED 10/22/2015 1100   LABBENZ NONE DETECTED 10/22/2015 1100   AMPHETMU NONE DETECTED 10/22/2015 1100   THCU NONE DETECTED 10/22/2015 1100   LABBARB NONE DETECTED 10/22/2015 1100    Alcohol Level:  Recent Labs Lab 10/22/15 0912  ETH <5   Urinalysis:  Recent Labs Lab 10/22/15 1040  COLORURINE YELLOW  LABSPEC 1.022  PHURINE 5.5  GLUCOSEU NEGATIVE  HGBUR MODERATE*  BILIRUBINUR NEGATIVE  KETONESUR NEGATIVE  PROTEINUR >300*  NITRITE NEGATIVE  LEUKOCYTESUR NEGATIVE   Studies/Results: Ct Angio Head W/cm &/or Wo Cm  10/22/2015  EXAM: CT ANGIOGRAPHY HEAD AND NECK TECHNIQUE: Multidetector CT imaging of the head and neck was performed using the standard protocol during bolus administration of intravenous contrast. Multiplanar CT image  reconstructions and MIPs were obtained to evaluate the vascular anatomy. Carotid stenosis measurements (when applicable) are obtained utilizing NASCET criteria, using the distal internal carotid diameter as the denominator. COMPARISON:  None. FINDINGS: CT HEAD Brain: No intracranial hemorrhage or CT evidence of large acute infarct. Small vessel disease changes. Limited for excluding small acute infarct given white matter changes. MR can be obtained for further delineation if clinically desired. No worrisome intracranial enhancing lesion. Global atrophy without hydrocephalus. Calvarium and skull base: Arachnoid granulations without destructive lesion. Paranasal sinuses: Dental disease with periapical lucency upper teeth without adjacent sinus  disease. Mastoid air cells and middle ear cavities are clear. Orbits: Minimal exophthalmos. CTA NECK Aortic arch: Common origin innominate and left common carotid artery. Plaque aortic arch and origin of great vessels with mild slightly moderate narrowing proximal left subclavian artery. Right carotid system: Calcified plaque carotid bifurcation with 48% diameter stenosis distal right common carotid artery. Calcified plaque proximal right internal carotid artery with less than 50% diameter stenosis. 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 This is flow limiting as the left internal carotid artery beyond this region is narrowed. Vertebral arteries:Left vertebral artery is slightly dominant size. Mild irregularity of the cervical segment of the vertebral arteries bilaterally. Skeleton: Dental disease. 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 Mild asymmetry palatine tonsils/ piriform sinus without mass identified. Upper chest: Chronic lung changes possibly with superimposed pulmonary vascular prominence. CTA HEAD Anterior circulation: No medium or large size vessel significant stenosis or occlusion. Middle cerebral artery branch vessel narrowing irregularity bilaterally. Plaque internal carotid artery cavernous segment without high-grade stenosis. There may be 2 tiny (1 mm) aneurysm of the right internal carotid artery cavernous segment. Fetal type contribution to the posterior cerebral arteries. Posterior circulation: Narrowed irregular distal vertebral arteries and basilar artery. Irregular narrowed posterior inferior cerebellar arteries, anterior inferior cerebral arteries and superior cerebellar arteries. Venous sinuses: Patent. Anatomic variants: As above. Delayed phase: As above. IMPRESSION: CT HEAD No intracranial  hemorrhage or CT evidence of large acute infarct. Small vessel disease changes. Global atrophy without hydrocephalus. CTA NECK Mild to slightly moderate narrowing proximal left subclavian artery. Calcified plaque right carotid bifurcation with 48% diameter stenosis distal right common carotid artery. Calcified plaque proximal right internal carotid artery with less than 50% diameter stenosis. 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 This is flow limiting as the left internal carotid artery beyond this region is narrowed. Left vertebral artery is slightly dominant size. Mild irregularity of the cervical segment of the vertebral arteries bilaterally. Dental disease. 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 Chronic lung changes possibly with superimposed pulmonary vascular prominence. CTA HEAD Anterior circulation without medium or large size vessel significant stenosis or occlusion. Middle cerebral artery branch vessel narrowing and irregularity bilaterally. Plaque internal carotid artery cavernous segment without high-grade stenosis. There may be 2 tiny (1 mm) aneurysms of the right internal carotid artery cavernous segment. Fetal type contribution to the posterior cerebral arteries. Irregular narrowed distal vertebral arteries and basilar artery. Irregular narrowed posterior inferior cerebellar arteries, anterior inferior cerebral arteries and superior cerebellar arteries. These results were called by telephone at the time of interpretation on 10/22/2015 at 9:42 am to Dr. Lavon Paganini who verbally acknowledged these results. Electronically Signed  By: Lacy Duverney M.D.   On: 10/22/2015 10:22   Ct Head Wo Contrast  10/22/2015  CLINICAL DATA:  Code stroke.  New onset left-sided weakness. EXAM: CT HEAD WITHOUT CONTRAST TECHNIQUE: Contiguous axial images were  obtained from the base of the skull through the vertex without intravenous contrast. COMPARISON:  None. FINDINGS: No acute infarct, hemorrhage, or mass lesion is present. Mild generalized atrophy and scattered subcortical white matter hypoattenuation is advanced for age. The cortex is intact. The basal ganglia are intact. The insular ribbon is within normal limits. The brainstem and cerebellum are unremarkable. No significant extra-axial fluid collection is present. Atherosclerotic changes are present within the cavernous internal carotid arteries bilaterally. The paranasal sinuses and mastoid air cells are clear. The calvarium is intact. No significant extracranial soft tissue lesions are present. The globes and orbits are intact. ASPECTS score = 10/10 [place] Stroke Program Early CT Score Normal score = 10 IMPRESSION: 1. No acute intracranial abnormality. 2. Mild generalized atrophy and white matter disease is slightly advanced for age. This likely reflects the sequela of chronic microvascular ischemia. These results were called by telephone at the time of interpretation on 10/22/2015 at 9:22 am to Dr. Lavon Paganini , who verbally acknowledged these results. Electronically Signed   By: Marin Roberts M.D.   On: 10/22/2015 09:24   Ct Angio Neck W/cm &/or Wo/cm  10/22/2015  EXAM: CT ANGIOGRAPHY HEAD AND NECK TECHNIQUE: Multidetector CT imaging of the head and neck was performed using the standard protocol during bolus administration of intravenous contrast. Multiplanar CT image reconstructions and MIPs were obtained to evaluate the vascular anatomy. Carotid stenosis measurements (when applicable) are obtained utilizing NASCET criteria, using the distal internal carotid diameter as the denominator. COMPARISON:  None. FINDINGS: CT HEAD Brain: No intracranial hemorrhage or CT evidence of large acute infarct. Small vessel disease changes. Limited for excluding small acute infarct given white matter changes. MR can be  obtained for further delineation if clinically desired. No worrisome intracranial enhancing lesion. Global atrophy without hydrocephalus. Calvarium and skull base: Arachnoid granulations without destructive lesion. Paranasal sinuses: Dental disease with periapical lucency upper teeth without adjacent sinus disease. Mastoid air cells and middle ear cavities are clear. Orbits: Minimal exophthalmos. CTA NECK Aortic arch: Common origin innominate and left common carotid artery. Plaque aortic arch and origin of great vessels with mild slightly moderate narrowing proximal left subclavian artery. Right carotid system: Calcified plaque carotid bifurcation with 48% diameter stenosis distal right common carotid artery. Calcified plaque proximal right internal carotid artery with less than 50% diameter stenosis. 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 This is flow limiting as the left internal carotid artery beyond this region is narrowed. Vertebral arteries:Left vertebral artery is slightly dominant size. Mild irregularity of the cervical segment of the vertebral arteries bilaterally. Skeleton: Dental disease. 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 Mild asymmetry palatine tonsils/ piriform sinus without mass identified. Upper chest: Chronic lung changes possibly with superimposed pulmonary vascular prominence. CTA HEAD Anterior circulation: No medium or large size vessel significant stenosis or occlusion. Middle cerebral artery branch vessel narrowing irregularity bilaterally. Plaque internal carotid artery cavernous segment without high-grade stenosis. There may be 2 tiny (1 mm) aneurysm of the right internal carotid artery cavernous segment. Fetal type contribution to the posterior cerebral arteries. Posterior circulation: Narrowed  irregular distal vertebral arteries and  basilar artery. Irregular narrowed posterior inferior cerebellar arteries, anterior inferior cerebral arteries and superior cerebellar arteries. Venous sinuses: Patent. Anatomic variants: As above. Delayed phase: As above. IMPRESSION: CT HEAD No intracranial hemorrhage or CT evidence of large acute infarct. Small vessel disease changes. Global atrophy without hydrocephalus. CTA NECK Mild to slightly moderate narrowing proximal left subclavian artery. Calcified plaque right carotid bifurcation with 48% diameter stenosis distal right common carotid artery. Calcified plaque proximal right internal carotid artery with less than 50% diameter stenosis. 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 This is flow limiting as the left internal carotid artery beyond this region is narrowed. Left vertebral artery is slightly dominant size. Mild irregularity of the cervical segment of the vertebral arteries bilaterally. Dental disease. 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 Chronic lung changes possibly with superimposed pulmonary vascular prominence. CTA HEAD Anterior circulation without medium or large size vessel significant stenosis or occlusion. Middle cerebral artery branch vessel narrowing and irregularity bilaterally. Plaque internal carotid artery cavernous segment without high-grade stenosis. There may be 2 tiny (1 mm) aneurysms of the right internal carotid artery cavernous segment. Fetal type contribution to the posterior cerebral arteries. Irregular narrowed distal vertebral arteries and basilar artery. Irregular narrowed posterior inferior cerebellar arteries, anterior inferior cerebral arteries and superior cerebellar arteries. These results were called by telephone at the time of interpretation on 10/22/2015 at 9:42 am to  Dr. Lavon PaganiniNandigam who verbally acknowledged these results. Electronically Signed   By: Lacy DuverneySteven  Olson M.D.   On: 10/22/2015 10:22   Medications: I have reviewed the patient's current medications. Scheduled Meds: . aspirin  81 mg Oral Daily  . clopidogrel  75 mg Oral Daily  . enoxaparin (LOVENOX) injection  40 mg Subcutaneous Q24H  . insulin aspart  0-5 Units Subcutaneous QHS  . insulin aspart  0-9 Units Subcutaneous TID WC  . insulin glargine  20 Units Subcutaneous QHS  . nicotine  14 mg Transdermal Daily  . sodium chloride flush  3 mL Intravenous Q12H  . traMADol  50-100 mg Oral 4 times per day   Continuous Infusions:  PRN Meds:.labetalol Assessment/Plan: Active Problems:   Weakness   Stroke (HCC)   TIA (transient ischemic attack)  [name] is a 65 year old male with insulin-dependent diabetes, hypertension, chronic low back pain hospitalized for TIA found to have CKD Stage III.  TIA: Weakness resolved on exam this morning. 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 PT/OT recommending CIR thought unclear if patient Lance have to be self-pay.  -Follow-up recommendations from vascular surgery, neurology -Follow-up echo -Continue ASA 81mg  and Plavix 75mg  per Neurology -Counsel on smoking cessation  Hypertension: BP trending 170s-180s/70s-80s. Home medications include amlodipine 10mg , lisinopril 40mg , HCTZ 25mg . -Resume antihypertensives tomorrow to allow for permissive hypertension -Continue labetalol 5mg  IV every 2 hours as needed for >200/110  Insulin-dependent Type 2 diabetes: A1c pending. Home medications include Lantus 30u QHS. CBGs trending <100 yesterday though now 154 this morning. -Continue Lantus 20u QHS with sensitive sliding scale insulin  CKD Stage 3: Baseline Crt appears to be around 1.5 which correlates with GFR 50s. -Follow BMET  Low back pain: Continue home tramadol 50-100mg  every 6 hours as needed for low back  pain.  #FEN:  -Diet: Carb Modified  #DVT prophylaxis: Lovenox  #CODE STATUS: FULL CODE  Dispo: Disposition is deferred at this  time, awaiting improvement of current medical problems.  Anticipated discharge in approximately 1-2 day(s).   The patient does have a current PCP [facility]) and does not need an [facility] hospital follow-up appointment after discharge.  The patient does not know have transportation limitations that hinder transportation to clinic appointments.  .Services Needed at time of discharge: Y = Yes, Blank = No PT:   OT:   RN:   Equipment:   Other:       Beather Arbour, MD 10/23/2015, 11:56 AM

## 2015-10-23 NOTE — Progress Notes (Signed)
Noted pt min guard assist 120 feet with P.T. Hand held assist. If pt would like to pursue an inpt rehab admission as a self pay admit, then place an inpt rehab consult for assessment for admit by Rehab MD. [phone]

## 2015-10-24 ENCOUNTER — Observation Stay (HOSPITAL_BASED_OUTPATIENT_CLINIC_OR_DEPARTMENT_OTHER): Payer: Non-veteran care

## 2015-10-24 ENCOUNTER — Observation Stay (HOSPITAL_COMMUNITY): Payer: Non-veteran care

## 2015-10-24 DIAGNOSIS — E119 Type 2 diabetes mellitus without complications: Secondary | ICD-10-CM | POA: Diagnosis not present

## 2015-10-24 DIAGNOSIS — G451 Carotid artery syndrome (hemispheric): Secondary | ICD-10-CM | POA: Diagnosis not present

## 2015-10-24 DIAGNOSIS — I10 Essential (primary) hypertension: Secondary | ICD-10-CM | POA: Diagnosis not present

## 2015-10-24 DIAGNOSIS — I6522 Occlusion and stenosis of left carotid artery: Secondary | ICD-10-CM | POA: Diagnosis not present

## 2015-10-24 DIAGNOSIS — F17200 Nicotine dependence, unspecified, uncomplicated: Secondary | ICD-10-CM | POA: Diagnosis not present

## 2015-10-24 DIAGNOSIS — G459 Transient cerebral ischemic attack, unspecified: Secondary | ICD-10-CM | POA: Diagnosis not present

## 2015-10-24 LAB — GLUCOSE, CAPILLARY
GLUCOSE-CAPILLARY: 119 mg/dL — AB (ref 65–99)
GLUCOSE-CAPILLARY: 71 mg/dL (ref 65–99)
Glucose-Capillary: 112 mg/dL — ABNORMAL HIGH (ref 65–99)

## 2015-10-24 LAB — HEMOGLOBIN A1C
Hgb A1c MFr Bld: 8.3 % — ABNORMAL HIGH (ref 4.8–5.6)
MEAN PLASMA GLUCOSE: 192 mg/dL

## 2015-10-24 MED ORDER — LISINOPRIL 20 MG PO TABS
40.0000 mg | ORAL_TABLET | Freq: Every day | ORAL | Status: DC
  Administered 2015-10-24: 40 mg via ORAL
  Filled 2015-10-24: qty 2

## 2015-10-24 MED ORDER — CLOPIDOGREL BISULFATE 75 MG PO TABS: 75.0000 mg | ORAL_TABLET | Freq: Every day | ORAL | Status: AC

## 2015-10-24 MED ORDER — HYDROCHLOROTHIAZIDE 25 MG PO TABS: 25.0000 mg | ORAL_TABLET | Freq: Every day | ORAL | Status: DC

## 2015-10-24 MED ORDER — NICOTINE 14 MG/24HR TD PT24: 14.0000 mg | MEDICATED_PATCH | Freq: Every day | TRANSDERMAL | Status: DC

## 2015-10-24 MED ORDER — ASPIRIN 81 MG PO CHEW: 81.0000 mg | CHEWABLE_TABLET | Freq: Every day | ORAL | Status: AC

## 2015-10-24 MED ORDER — ATORVASTATIN CALCIUM 40 MG PO TABS: 40.0000 mg | ORAL_TABLET | Freq: Every day | ORAL | Status: AC

## 2015-10-24 MED ORDER — NICOTINE POLACRILEX 2 MG MT GUM: 2.0000 mg | CHEWING_GUM | OROMUCOSAL | Status: DC | PRN

## 2015-10-24 NOTE — Progress Notes (Signed)
Physical Therapy Treatment Patient Details Name: Lance Duncan MRN: [id number] DOB: 13-Jan-1951 Today's Date: 10/24/2015    History of Present Illness Lance PandyRobert Belardo is a 65 year old male with a past medical history significant for HTN, T2DM, and chronic low back pain who presented to the ED due to a several hour history of left sided weakness. He states that he was having trouble sleeping early this morning due to his back pain and tried to sleep in his recliner. Around 4 am, he noticed that his left side was numb and he was unable to move his left arm. When he tried to stand from his recliner to use the restroom, he could barely walk due to weakness of his left leg. He was able to make it to the restroom, but fell headfirst into the bathtub due to weakness.    PT Comments    Pt's L sided UE and LE strength much improved compared to yesterday. Pt with improved balance as demo'd by score of 20 on DGI compared to 16 yesterday. No L knee buckling during amb. Discussed at length the benefits of staying with ex-wife for a few days compared to returning home alone. Pt progressed well enough to transition to outpt PT to focus on residual L UE and LE weakness and impaired balance.  Follow Up Recommendations  Outpatient PT;Supervision - Intermittent     Equipment Recommendations  None recommended by PT (pt has cane at home)    Recommendations for Other Services       Precautions / Restrictions Precautions Precautions: Fall Restrictions Weight Bearing Restrictions: No    Mobility  Bed Mobility Overal bed mobility: Modified Independent Bed Mobility: Supine to Sit     Supine to sit: Modified independent (Device/Increase time)     General bed mobility comments: used log roll technique, minimal use of bed rail  Transfers Overall transfer level: Needs assistance Equipment used: None Transfers: Sit to/from Stand Sit to Stand: Supervision         General transfer comment: pt with  good technique, guarded speed due to back pain  Ambulation/Gait Ambulation/Gait assistance: Supervision Ambulation Distance (Feet): 300 Feet Assistive device: Straight cane;None Gait Pattern/deviations: Step-through pattern Gait velocity: dec Gait velocity interpretation: Below normal speed for age/gender General Gait Details: pt with no episodes of LOB but reports "i still feel a little weak." pt inquired about cane. Trialed cane (which pt has at home) and pt reported feeling more steady. V/c's for appropriate sequencing of cane   Stairs Stairs: Yes Stairs assistance: Min guard Stair Management: Two rails Number of Stairs: 12 General stair comments: step to  Wheelchair Mobility    Modified Rankin (Stroke Patients Only) Modified Rankin (Stroke Patients Only) Pre-Morbid Rankin Score: No symptoms Modified Rankin: Slight disability     Balance                                    Cognition Arousal/Alertness: Awake/alert Behavior During Therapy: WFL for tasks assessed/performed Overall Cognitive Status: Within Functional Limits for tasks assessed                      Exercises      General Comments General comments (skin integrity, edema, etc.): pt with rug burn on L LE from crawling on floor      Pertinent Vitals/Pain Pain Assessment: 0-10 Pain Score: 7  Pain Location: back Pain Descriptors / Indicators:  Sore Pain Intervention(s): Monitored during session    Home Living                      Prior Function            PT Goals (current goals can now be found in the care plan section) Acute Rehab PT Goals Patient Stated Goal: go home Progress towards PT goals: Progressing toward goals    Frequency  Min 4X/week    PT Plan Discharge plan needs to be updated    Co-evaluation             End of Session Equipment Utilized During Treatment: Gait belt Activity Tolerance: No increased pain Patient left: in chair;with call  bell/phone within reach     Time: 0916-0943 PT Time Calculation (min) (ACUTE ONLY): 27 min  Charges:  $Gait Training: 8-22 mins $Therapeutic Activity: 8-22 mins                    G Codes:      Marcene Brawn 10/24/2015, 10:16 AM   Lewis Shock, PT, DPT Pager #: [phone] Office #: [phone]

## 2015-10-24 NOTE — Progress Notes (Signed)
Subjective: [name] is feeling well this morning. His left sided weakness has continued to improve and he is ambulating better today. He is looking forward to leaving the hospital, but is a little worried about having to have a procedure done.  Objective: Vital signs in last 24 hours: Filed Vitals:   10/24/15 0130 10/24/15 0541 10/24/15 0828 10/24/15 0956  BP: 195/70 187/85 187/85 183/75  Pulse: 57 57  61  Temp: 98.8 F (37.1 C) 98.4 F (36.9 C)  97.5 F (36.4 C)  TempSrc: Oral Oral  Oral  Resp: 18 18  20   Height:      Weight:      SpO2: 97% 100%  95%   Weight change:   Intake/Output Summary (Last 24 hours) at 10/24/15 1320 Last data filed at 10/24/15 0800  Gross per 24 hour  Intake    840 ml  Output    650 ml  Net    190 ml   Blood pressure 188/75, pulse 61, temperature 97.5 F (36.5 C), resp. rate 20, SpO2 95 %. General: AA male, alert, cooperative, NAD. Neurologic: Alert & oriented x3, cranial nerves II-XII intact, sensation intact to light touch. 4+/5 strength in left upper and lower extremities. 5/5 strength on the right upper and lower extremities.  HEENT: PERRL, EOMI. Moist mucus membranes Neck: Full range of motion without pain Lungs: Clear to ascultation bilaterally, normal work of respiration, no wheezes, rales, rhonchi Heart: RRR, no murmurs, gallops, or rubs Abdomen: Soft, non-tender, non-distended, BS + in all four quadrants Extremities: No cyanosis, clubbing, or edema  Micro Results: No results found for this or any previous visit (from the past 240 hour(s)). Studies/Results: Ct Head Wo Contrast  10/24/2015  CLINICAL DATA:  Left-sided deficits. EXAM: CT HEAD WITHOUT CONTRAST TECHNIQUE: Contiguous axial images were obtained from the base of the skull through the vertex without intravenous contrast. COMPARISON:  10/22/2015 FINDINGS: Mild cerebral atrophy. No acute intracranial abnormality. Specifically, no hemorrhage, hydrocephalus, mass lesion, acute  infarction, or significant intracranial injury. No acute calvarial abnormality. Visualized paranasal sinuses and mastoids clear. Orbital soft tissues unremarkable. IMPRESSION: No acute intracranial abnormality. Electronically Signed   By: Charlett NoseKevin  Dover M.D.   On: 10/24/2015 10:27   Medications: I have reviewed the patient's current medications. Scheduled Meds: . aspirin  81 mg Oral Daily  . atorvastatin  40 mg Oral q1800  . clopidogrel  75 mg Oral Daily  . enoxaparin (LOVENOX) injection  40 mg Subcutaneous Q24H  . insulin aspart  0-5 Units Subcutaneous QHS  . insulin aspart  0-9 Units Subcutaneous TID WC  . insulin glargine  20 Units Subcutaneous QHS  . lisinopril  40 mg Oral Daily  . nicotine  14 mg Transdermal Daily  . sodium chloride flush  3 mL Intravenous Q12H  . traMADol  50-100 mg Oral 4 times per day   Continuous Infusions:  PRN Meds:.labetalol Assessment/Plan: Active Problems:   Weakness   Stroke (HCC)   TIA (transient ischemic attack)  TIA: No infarct on initial CT or follow up CT 24 hours later. Left sided weakness has almost completely resolved. Presented with left sided weakness on 4/17 that has almost completely resolved. No other neurological deficts were noted on exam. No specific source has been identified, likely due to HTN, diabetes, possible undiagnosed OSA. CTA neck incidentally showed almost complete occlusion of left proximal internal carotid artery.  -IR performed cerebral angiogram to further evaluate which showed 95% stenosis of the left proximal ICA.  -Carotid  US pending, if >80% stenosis of proximal left ICA confirmed Lance get CEA in one week per vascular surgery after cardiac clearance -ECHO ruled out cardiac embolic source of stroke -atorvastatin 40 mg daily for secondary prevention -Aspirin 81 mg daily plus plavix 75 mg daily per neuro -PT suggests outpatient home PT -Patient would like to quite smoking, nicotine patch started 4/19. Lance send home with  nicotine gum too.  HTN: BP elevated on admission. Takes amlodipine 10 mg, lisinopril 40 mg, HCTZ 25 mg daily at home for HTN -Held home bp meds initially for permissive hypertension -Bp has been less than 200/110 since admission -lisinopril 40 mg re-started on 4/19 -Give Labetalol 5 mg IV q2h prn for SBP >200, DBP >110 -Has not needed bp meds since admission  DM type II: Uses only Lantus at home, says he uses 30 units daily. Had episode of hypoglycemia while NPO -A1c 8.3% Goal is 7. Lance be further managed as an outpatient. Currently only on lantus  AKI vs CKD: Cr 1.51 on admission. Unclear what his baseline is.  -Cr has remained stable since admission  Chronic low back pain: Has had low back pain for years and uses tramadol 50 mg q6h at home -continue tramadol q6h prn for pain  DVT/PE PPx: Lovenox [place]  Dispo:  Pending carotid dopplers, likely home today.   This is a Psychologist, occupational Note.  The care of the patient was discussed with Dr. Heide Spark and the assessment and plan formulated with their assistance.  Please see their attached note for official documentation of the daily encounter.     Bennye Alm, Med Student 10/24/2015, 1:20 PM

## 2015-10-24 NOTE — Care Management Note (Signed)
Case Management Note  Patient Details  Name: Lance Duncan MRN: [id number] Date of Birth: 02-07-1951  Subjective/Objective:                    Action/Plan: CM spoke with [place] CSW from [facility] in [place] yesterday to inform her of his admission with us. CM will continue to follow for discharge needs and inform the [facility] of any needs.   Expected Discharge Date:                  Expected Discharge Plan:     In-House Referral:     Discharge planning Services     Post Acute Care Choice:    Choice offered to:     DME Arranged:    DME Agency:     HH Arranged:    HH Agency:     Status of Service:  In process, will continue to follow  Medicare Important Message Given:    Date Medicare IM Given:    Medicare IM give by:    Date Additional Medicare IM Given:    Additional Medicare Important Message give by:     If discussed at Long Length of Stay Meetings, dates discussed:    Additional Comments:  Kermit BaloKelli F Alanna Storti, RN 10/24/2015, 3:47 PM

## 2015-10-24 NOTE — Progress Notes (Signed)
VASCULAR SURGERY:  ADDENDUM: I have reviewed the carotid duplex can results. There is a moderate 40-59% right carotid stenosis with a tight, clearly greater than 80% left carotid stenosis. I have scheduled him for a left carotid endarterectomy on 11/06/15. The left carotid stenosis is asymptomatic. Given that this stenosis is tight and also his recent right brain TIA, I will leave him on Plavix through surgery. The medical team is arranging cardiac clearance. From my standpoint he could be discharged and we can bring him back electively for his left carotid endarterectomy on 11/06/15. My partner Dr. Fabienne Brunsharles Fields will be covering for me while I'm away to meeting.  Waverly Ferrarihristopher Aberdeen Hafen, MD, FACS [employer] [phone] Office: [phone]

## 2015-10-24 NOTE — Discharge Summary (Signed)
Name: Lance Duncan MRN: [id number] DOB: 01-13-1951 65 y.o. [facility]  Date of Admission: 10/22/2015  9:01 AM Date of Discharge: 10/24/2015 Attending Physician: Earl LagosNischal Narendra, MD  Discharge Diagnosis: TIA Insulin-dependent Type 2 diabetes Tobacco abuse Hypertension  Discharge Medications:   Medication List    STOP taking these medications        amLODipine 10 MG tablet  Commonly known as:  NORVASC      TAKE these medications        aspirin 81 MG chewable tablet  Chew 1 tablet (81 mg total) by mouth daily.     atorvastatin 40 MG tablet  Commonly known as:  LIPITOR  Take 1 tablet (40 mg total) by mouth daily at 6 PM.     clopidogrel 75 MG tablet  Commonly known as:  PLAVIX  Take 1 tablet (75 mg total) by mouth daily.     dorzolamide-timolol 22.3-6.8 MG/ML ophthalmic solution  Commonly known as:  COSOPT  Place 1 drop into both eyes 2 (two) times daily.     hydrochlorothiazide 25 MG tablet  Commonly known as:  HYDRODIURIL  Take 25 mg by mouth daily.     insulin glargine 100 unit/mL Sopn  Commonly known as:  LANTUS  Inject 66-74 Units into the skin at bedtime. Take 66 units before the first meal, and 74 units at bedtime     latanoprost 0.005 % ophthalmic solution  Commonly known as:  XALATAN  Place 1 drop into both eyes at bedtime.     lisinopril 40 MG tablet  Commonly known as:  PRINIVIL,ZESTRIL  Take 40 mg by mouth daily.     nicotine 14 mg/24hr patch  Commonly known as:  NICODERM CQ - dosed in mg/24 hours  Place 1 patch (14 mg total) onto the skin daily.     nicotine polacrilex 2 MG gum  Commonly known as:  NICORETTE  Take 1 each (2 mg total) by mouth every 2 (two) hours as needed for smoking cessation.     traMADol 50 MG tablet  Commonly known as:  ULTRAM  Take 50-100 mg by mouth every 6 (six) hours as needed for severe pain.        Disposition and follow-up:   [facility].  At the hospital follow up visit please address:  Smoking cessation: adherence to nicotine replacement therapy  Type 2 diabetes: augmentation of current insulin regimen  Adherence to statin therapy: Recheck LFTs  CV risks stratification: Referral to Cardiology  BP control  ASA & Plavix adherence  2.  Labs / imaging needed at time of follow-up: final Caroid doppler report  3.  Pending labs/ test needing follow-up: LFTs [statin]   Follow-up Appointments:  Follow-up Information    [facility] Schedule an appointment as soon as possible for a visit in 1 week.   Contact information:   [address] [address] [place] [id number] [phone]       Discharge Instructions:   Consultations: Treatment Team:  Chuck Hinthristopher S Dickson, MD Sherren Kernsharles E Fields, MD  Procedures Performed:  Ct Angio Head W/cm &/or Wo Cm  10/22/2015  EXAM: CT ANGIOGRAPHY HEAD AND NECK TECHNIQUE: Multidetector CT imaging of the head and neck was performed using the standard protocol during bolus administration of intravenous contrast. Multiplanar CT image reconstructions and MIPs were obtained to evaluate the vascular anatomy. Carotid stenosis measurements (when applicable) are  obtained utilizing NASCET criteria, using the distal internal carotid diameter as the denominator. COMPARISON:  None. FINDINGS: CT HEAD Brain: No intracranial hemorrhage or CT evidence of large acute infarct. Small vessel disease changes. Limited for excluding small acute infarct given white matter changes. MR can be obtained for further delineation if clinically desired. No worrisome intracranial enhancing lesion. Global atrophy without hydrocephalus. Calvarium and skull base: Arachnoid granulations without destructive lesion. Paranasal sinuses: Dental disease with periapical lucency upper teeth without adjacent sinus disease. Mastoid air cells and middle ear cavities  are clear. Orbits: Minimal exophthalmos. CTA NECK Aortic arch: Common origin innominate and left common carotid artery. Plaque aortic arch and origin of great vessels with mild slightly moderate narrowing proximal left subclavian artery. Right carotid system: Calcified plaque carotid bifurcation with 48% diameter stenosis distal right common carotid artery. Calcified plaque proximal right internal carotid artery with less than 50% diameter stenosis. 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 This is flow limiting as the left internal carotid artery beyond this region is narrowed. Vertebral arteries:Left vertebral artery is slightly dominant size. Mild irregularity of the cervical segment of the vertebral arteries bilaterally. Skeleton: Dental disease. 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 Mild asymmetry palatine tonsils/ piriform sinus without mass identified. Upper chest: Chronic lung changes possibly with superimposed pulmonary vascular prominence. CTA HEAD Anterior circulation: No medium or large size vessel significant stenosis or occlusion. Middle cerebral artery branch vessel narrowing irregularity bilaterally. Plaque internal carotid artery cavernous segment without high-grade stenosis. There may be 2 tiny (1 mm) aneurysm of the right internal carotid artery cavernous segment. Fetal type contribution to the posterior cerebral arteries. Posterior circulation: Narrowed irregular distal vertebral arteries and basilar artery. Irregular narrowed posterior inferior cerebellar arteries, anterior inferior cerebral arteries and superior cerebellar arteries. Venous sinuses: Patent. Anatomic variants: As above. Delayed phase: As above. IMPRESSION: CT HEAD No intracranial hemorrhage or CT evidence of large acute infarct. Small  vessel disease changes. Global atrophy without hydrocephalus. CTA NECK Mild to slightly moderate narrowing proximal left subclavian artery. Calcified plaque right carotid bifurcation with 48% diameter stenosis distal right common carotid artery. Calcified plaque proximal right internal carotid artery with less than 50% diameter stenosis. 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 This is flow limiting as the left internal carotid artery beyond this region is narrowed. Left vertebral artery is slightly dominant size. Mild irregularity of the cervical segment of the vertebral arteries bilaterally. Dental disease. 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 Chronic lung changes possibly with superimposed pulmonary vascular prominence. CTA HEAD Anterior circulation without medium or large size vessel significant stenosis or occlusion. Middle cerebral artery branch vessel narrowing and irregularity bilaterally. Plaque internal carotid artery cavernous segment without high-grade stenosis. There may be 2 tiny (1 mm) aneurysms of the right internal carotid artery cavernous segment. Fetal type contribution to the posterior cerebral arteries. Irregular narrowed distal vertebral arteries and basilar artery. Irregular narrowed posterior inferior cerebellar arteries, anterior inferior cerebral arteries and superior cerebellar arteries. These results were called by telephone at the time of interpretation on 10/22/2015 at 9:42 am to Dr. Lavon Paganini who verbally acknowledged these results. Electronically Signed   By: Lacy Duverney M.D.   On: 10/22/2015 10:22   Ct Head  Wo Contrast  10/24/2015  CLINICAL DATA:  Left-sided deficits. EXAM: CT HEAD WITHOUT CONTRAST TECHNIQUE: Contiguous axial images were obtained from the base of the skull through the vertex without intravenous contrast.  COMPARISON:  10/22/2015 FINDINGS: Mild cerebral atrophy. No acute intracranial abnormality. Specifically, no hemorrhage, hydrocephalus, mass lesion, acute infarction, or significant intracranial injury. No acute calvarial abnormality. Visualized paranasal sinuses and mastoids clear. Orbital soft tissues unremarkable. IMPRESSION: No acute intracranial abnormality. Electronically Signed   By: Charlett Nose M.D.   On: 10/24/2015 10:27   Ct Head Wo Contrast  10/22/2015  CLINICAL DATA:  Code stroke.  New onset left-sided weakness. EXAM: CT HEAD WITHOUT CONTRAST TECHNIQUE: Contiguous axial images were obtained from the base of the skull through the vertex without intravenous contrast. COMPARISON:  None. FINDINGS: No acute infarct, hemorrhage, or mass lesion is present. Mild generalized atrophy and scattered subcortical white matter hypoattenuation is advanced for age. The cortex is intact. The basal ganglia are intact. The insular ribbon is within normal limits. The brainstem and cerebellum are unremarkable. No significant extra-axial fluid collection is present. Atherosclerotic changes are present within the cavernous internal carotid arteries bilaterally. The paranasal sinuses and mastoid air cells are clear. The calvarium is intact. No significant extracranial soft tissue lesions are present. The globes and orbits are intact. ASPECTS score = 10/10 [place] Stroke Program Early CT Score Normal score = 10 IMPRESSION: 1. No acute intracranial abnormality. 2. Mild generalized atrophy and white matter disease is slightly advanced for age. This likely reflects the sequela of chronic microvascular ischemia. These results were called by telephone at the time of interpretation on 10/22/2015 at 9:22 am to Dr. Lavon Paganini , who verbally acknowledged these results. Electronically Signed   By: Marin Roberts M.D.   On: 10/22/2015 09:24   Ct Angio Neck W/cm &/or Wo/cm  10/22/2015  EXAM: CT ANGIOGRAPHY HEAD AND NECK TECHNIQUE:  Multidetector CT imaging of the head and neck was performed using the standard protocol during bolus administration of intravenous contrast. Multiplanar CT image reconstructions and MIPs were obtained to evaluate the vascular anatomy. Carotid stenosis measurements (when applicable) are obtained utilizing NASCET criteria, using the distal internal carotid diameter as the denominator. COMPARISON:  None. FINDINGS: CT HEAD Brain: No intracranial hemorrhage or CT evidence of large acute infarct. Small vessel disease changes. Limited for excluding small acute infarct given white matter changes. MR can be obtained for further delineation if clinically desired. No worrisome intracranial enhancing lesion. Global atrophy without hydrocephalus. Calvarium and skull base: Arachnoid granulations without destructive lesion. Paranasal sinuses: Dental disease with periapical lucency upper teeth without adjacent sinus disease. Mastoid air cells and middle ear cavities are clear. Orbits: Minimal exophthalmos. CTA NECK Aortic arch: Common origin innominate and left common carotid artery. Plaque aortic arch and origin of great vessels with mild slightly moderate narrowing proximal left subclavian artery. Right carotid system: Calcified plaque carotid bifurcation with 48% diameter stenosis distal right common carotid artery. Calcified plaque proximal right internal carotid artery with less than 50% diameter stenosis. 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 This is flow limiting as the left internal carotid artery beyond this region is narrowed. Vertebral arteries:Left vertebral artery is slightly dominant size. Mild irregularity of the cervical segment of the vertebral arteries bilaterally. Skeleton: Dental disease. 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 Other neck: Goiter with substernal extension on  the left. Mild asymmetry palatine tonsils/  piriform sinus without mass identified. Upper chest: Chronic lung changes possibly with superimposed pulmonary vascular prominence. CTA HEAD Anterior circulation: No medium or large size vessel significant stenosis or occlusion. Middle cerebral artery branch vessel narrowing irregularity bilaterally. Plaque internal carotid artery cavernous segment without high-grade stenosis. There may be 2 tiny (1 mm) aneurysm of the right internal carotid artery cavernous segment. Fetal type contribution to the posterior cerebral arteries. Posterior circulation: Narrowed irregular distal vertebral arteries and basilar artery. Irregular narrowed posterior inferior cerebellar arteries, anterior inferior cerebral arteries and superior cerebellar arteries. Venous sinuses: Patent. Anatomic variants: As above. Delayed phase: As above. IMPRESSION: CT HEAD No intracranial hemorrhage or CT evidence of large acute infarct. Small vessel disease changes. Global atrophy without hydrocephalus. CTA NECK Mild to slightly moderate narrowing proximal left subclavian artery. Calcified plaque right carotid bifurcation with 48% diameter stenosis distal right common carotid artery. Calcified plaque proximal right internal carotid artery with less than 50% diameter stenosis. 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 This is flow limiting as the left internal carotid artery beyond this region is narrowed. Left vertebral artery is slightly dominant size. Mild irregularity of the cervical segment of the vertebral arteries bilaterally. Dental disease. 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 Chronic lung changes possibly with superimposed pulmonary vascular prominence. CTA HEAD Anterior circulation without medium or large size vessel significant stenosis or  occlusion. Middle cerebral artery branch vessel narrowing and irregularity bilaterally. Plaque internal carotid artery cavernous segment without high-grade stenosis. There may be 2 tiny (1 mm) aneurysms of the right internal carotid artery cavernous segment. Fetal type contribution to the posterior cerebral arteries. Irregular narrowed distal vertebral arteries and basilar artery. Irregular narrowed posterior inferior cerebellar arteries, anterior inferior cerebral arteries and superior cerebellar arteries. These results were called by telephone at the time of interpretation on 10/22/2015 at 9:42 am to Dr. Lavon Paganini who verbally acknowledged these results. Electronically Signed   By: Lacy Duverney M.D.   On: 10/22/2015 10:22    2D Echo: LV EF: 65% - 70%  ------------------------------------------------------------------- Indications: CVA 436.  ------------------------------------------------------------------- History: PMH: Transient ischemic attack. Risk factors: Current tobacco use. Hypertension. Diabetes mellitus.  ------------------------------------------------------------------- Study Conclusions  - Left ventricle: The cavity size was normal. There was mild  concentric hypertrophy. Systolic function was vigorous. The  estimated ejection fraction was in the range of 65% to 70%. Wall  motion was normal; there were no regional wall motion  abnormalities. Doppler parameters are consistent with abnormal  left ventricular relaxation (grade 1 diastolic dysfunction). - Aortic valve: Trileaflet; mildly thickened, mildly calcified  leaflets.  Impressions:  - No cardiac source of emboli was indentified.  Transthoracic echocardiography. M-mode, complete 2D, spectral Doppler, and color Doppler. Birthdate: Patient birthdate: 10/02/1950. Age: Patient is 65 yr old. Sex: Gender: male. BMI: 35.4 kg/m^2. Blood pressure: 180/65 Patient status: Inpatient. Study  date: Study date: 10/23/2015. Study time: 02:52 PM. Location: Bedside.  -------------------------------------------------------------------  ------------------------------------------------------------------- Left ventricle: The cavity size was normal. There was mild concentric hypertrophy. Systolic function was vigorous. The estimated ejection fraction was in the range of 65% to 70%. Wall motion was normal; there were no regional wall motion abnormalities. Doppler parameters are consistent with abnormal left ventricular relaxation (grade 1 diastolic dysfunction).  ------------------------------------------------------------------- Aortic valve: Trileaflet; mildly thickened, mildly calcified leaflets. Mobility was not restricted. Doppler: Transvalvular velocity was within the normal range. There was no  stenosis. There was no regurgitation.  ------------------------------------------------------------------- Aorta: Aortic root: The aortic root was normal in size.  ------------------------------------------------------------------- Mitral valve: Structurally normal valve. Mobility was not restricted. Doppler: Transvalvular velocity was within the normal range. There was no evidence for stenosis. There was trivial regurgitation.  ------------------------------------------------------------------- Left atrium: The atrium was normal in size.  ------------------------------------------------------------------- Right ventricle: The cavity size was normal. Wall thickness was normal. Systolic function was normal.  ------------------------------------------------------------------- Pulmonic valve: Poorly visualized. Structurally normal valve. Cusp separation was normal. Doppler: Transvalvular velocity was within the normal range. There was no evidence for stenosis. There was no  regurgitation.  ------------------------------------------------------------------- Tricuspid valve: Structurally normal valve. Doppler: Transvalvular velocity was within the normal range. There was no regurgitation.  ------------------------------------------------------------------- Pulmonary artery: The main pulmonary artery was normal-sized. Systolic pressure was within the normal range.  ------------------------------------------------------------------- Right atrium: The atrium was normal in size.  ------------------------------------------------------------------- Pericardium: There was no pericardial effusion.  ------------------------------------------------------------------- Systemic veins: Inferior vena cava: The vessel was normal in size.  ------------------------------------------------------------------- Measurements  Left ventricle Value Reference LV ID, ED, PLAX chordal 44 mm 43 - 52 LV ID, ES, PLAX chordal (H) 40 mm 23 - 38 LV fx shortening, PLAX chordal (L) 9 % >=29 LV PW thickness, ED 12 mm --------- IVS/LV PW ratio, ED 1 <=1.3 LV e&', lateral 7.4 cm/s --------- LV e&', medial 6.53 cm/s --------- LV e&', average 6.97 cm/s ---------  Ventricular septum Value Reference IVS thickness, ED 12 mm ---------  LVOT Value Reference LVOT ID, S 25 mm --------- LVOT area 4.91 cm^2 ---------  Aorta Value Reference Aortic root ID, ED 34 mm  ---------  Left atrium Value Reference LA ID, A-P, ES 38 mm --------- LA ID/bsa, A-P 1.59 cm/m^2 <=2.2 LA volume, S 52.5 ml --------- LA volume/bsa, S 21.9 ml/m^2 --------- LA volume, ES, 1-p A4C 59.9 ml --------- LA volume/bsa, ES, 1-p A4C 25 ml/m^2 --------- LA volume, ES, 1-p A2C 41.4 ml --------- LA volume/bsa, ES, 1-p A2C 17.3 ml/m^2 ---------  Mitral valve Value Reference Mitral deceleration time (H) 331 ms 150 - 230 Mitral E/A ratio, peak 0.7 ---------  Right ventricle Value Reference RV s&', lateral, S 14.5 cm/s ---------  Legend: (L) and (H) mark values outside specified reference range.   Carotid dopplers:  Right : 40-59% internal carotid artery stenosis.  Left - 80% to 99% ICA stenosis.  Bilateral - Vertebral artery flow is antegrade   Admission HPI: [name] is a 65 y.o. male w/ PMHx of HTN, DM type II, presents to the ED w/ complaints of left-sided weakness. Patient says he has chronic back pain, and was not sleeping well this evening because of this. While in and out of sleep while sitting in his recliner, he said he noticed some mild left-sided numbness in his hand starting around 3 AM. He then states that around 5 AM, he started to feel some left hand weakness when trying to reach for his channel changer. He had such significant hand weakness that he had to reach for the remote with his right hand. He then attempted to get up and use the restroom and was unable to support his weight with his left leg and had severe weakness and was unable to stand. He denies any facial  numbness or weakness/asymmetry. No confusion, dizziness, or lightheadedness, no difficulty swallowing or slurred speech. No palpitations, chest pain, or SOB. Says he has not had these symptoms in the past.    Hospital Course by problem list:   TIA:  Exam on admission notable for 3/5 weakness in left upper and lower extremity with no other neurological deficits. Non contrast CT showed no evidence of hemorrhage, and CTA head showed no acute abnormalities. CT angio neck incidentally showed complete occlusion of the proximal left internal carotid artery with string sign [see details above]. MRI was ordered, but not performed due to penile implant. Weakness improved thoughout his hospital stay and was able to tolerate ambulating with a cane. Neurology consulted and were concerned that he had a right subcortical infarct that did not show up on initial CT, so a repeat CT was done after 24 hours of symptoms which showed no infarct. IR performed cerebral angiogram to further evaluate his left internal carotid stenosis which showed 95% stenosis of the left proximal ICA [see details above]. Vascular Surgery was consulted, ordered carotid US to confirm stenosis, and preliminary findings are noted above. Vascular surgery suggests that he see an outpatient cardiologist prior to CEA for cardiac clearance.  Echo was unremarkable for an embolic source [see details above]. He was started on atorvastatin 40 mg daily for secondary prevention and put on dual antiplatelet therapy of aspirin 81 mg and Plavix 75 mg daily per neurology recommendations.   Tobacco abuse: For smoking cessation, he was started on a nicotine patch and nicotine gum.  Hypertension: His blood pressure was elevated on admission to 192/92 but never exceeded 200/110 to allow for permissive HTN. He will resume home amlodipine 10 mg, lisinopril 40 mg, HCTZ 25 mg when going home.  Insulin-dependent Type 2 diabetes : His A1c during hospitalization was 8.3%.  Augmentation of his diabetic regimen will be deferred to his PCP.  CKD Stage 3: Creatinine was stable at 1.5 throughout his hospital stay.  Discharge Vitals:   BP 180/69 mmHg  Pulse 61  Temp(Src) 97.5 F (36.4 C) (Oral)  Resp 20  Ht 5\' 10"  (1.778 m)  Wt 246 lb 14.4 oz (111.993 kg)  BMI 35.43 kg/m2  SpO2 95%  Discharge Labs:  Results for orders placed or performed during the hospital encounter of 10/22/15 (from the past 24 hour(s))  Glucose, capillary     Status: Abnormal   Collection Time: 10/23/15  4:47 PM  Result Value Ref Range   Glucose-Capillary 119 (H) 65 - 99 mg/dL  Glucose, capillary     Status: Abnormal   Collection Time: 10/23/15  9:30 PM  Result Value Ref Range   Glucose-Capillary 123 (H) 65 - 99 mg/dL   Comment 1 Notify RN    Comment 2 Document in Chart   Glucose, capillary     Status: None   Collection Time: 10/24/15  6:20 AM  Result Value Ref Range   Glucose-Capillary 71 65 - 99 mg/dL   Comment 1 Notify RN    Comment 2 Document in Chart   Glucose, capillary     Status: Abnormal   Collection Time: 10/24/15 11:45 AM  Result Value Ref Range   Glucose-Capillary 112 (H) 65 - 99 mg/dL   Lipid Panel     Component Value Date/Time   CHOL 195 10/23/2015 0534   TRIG 199* 10/23/2015 0534   HDL 25* 10/23/2015 0534   CHOLHDL 7.8 10/23/2015 0534   VLDL 40 10/23/2015 0534   LDLCALC 130* 10/23/2015 0534       Signed: Beather Arbour, MD 10/24/2015, 3:12 PM    Services Ordered on Discharge: None Equipment Ordered on Discharge: None

## 2015-10-24 NOTE — Progress Notes (Signed)
   VASCULAR SURGERY ASSESSMENT & PLAN:   As per my note yesterday, this patient has a smooth 50% right carotid stenosis by arteriogram. I reviewed his cerebral arteriogram with Dr. Corliss Skainseveshwar.  Aspirin and Plavix have been added since he had his TIA. I would not consider right carotid endarterectomy unless he had another right hemispheric event on aspirin and Plavix. In addition he has some concerns about whether this all needs to be followed at the [facility].   With regards to his left carotid stenosis. His cerebral arteriogram shows an 80% left carotid stenosis. I have ordered a duplex as the CT angiogram suggested a tighter stenosis. However, CT angiogram can be misleading with regards to percentage of stenosis. Regardless, the left carotid stenosis is asymptomatic. If the carotid duplex scan suggests that this stenosis is greater than 80% then I would recommend left carotid endarterectomy electively. I can arrange this as an outpatient once discharged and would likely want to stop his Plavix prior to surgery. Again, he has concerns about whether this should be done at the [facility].   I will be out of town until next week and my partner Dr. Darrick PennaFields will be following   SUBJECTIVE: no specific complaints.  PHYSICAL EXAM: Filed Vitals:   10/23/15 1834 10/23/15 2120 10/24/15 0130 10/24/15 0541  BP: 176/66 187/78 195/70 187/85  Pulse: 61 58 57 57  Temp: 98.3 F (36.8 C) 98.6 F (37 C) 98.8 F (37.1 C) 98.4 F (36.9 C)  TempSrc: Oral Oral Oral Oral  Resp: 18 18 18 18   Height:      Weight:      SpO2: 99% 97% 97% 100%   Strength has returned to normal and his left upper extremity and left lower extremity.  LABS:  Lab Results  Component Value Date   CREATININE 1.52* 10/23/2015   CBG (last 3)   Recent Labs  10/23/15 1647 10/23/15 2130 10/24/15 0620  GLUCAP 119* 123* 71    Active Problems:   Weakness   Stroke (HCC)   TIA (transient ischemic attack)   Lance Duncan Beeper:  [phone] 10/24/2015

## 2015-10-24 NOTE — Progress Notes (Addendum)
Subjective: This morning, he reports feeling better. He would like to know the details of his workup to which I expressed we would make sure his PCP was aware of any changes and information.   Objective: Vital signs in last 24 hours: Filed Vitals:   10/23/15 1834 10/23/15 2120 10/24/15 0130 10/24/15 0541  BP: 176/66 187/78 195/70 187/85  Pulse: 61 58 57 57  Temp: 98.3 F (36.8 C) 98.6 F (37 C) 98.8 F (37.1 C) 98.4 F (36.9 C)  TempSrc: Oral Oral Oral Oral  Resp: 18 18 18 18   Height:      Weight:      SpO2: 99% 97% 97% 100%   Weight change:   Intake/Output Summary (Last 24 hours) at 10/24/15 0713 Last data filed at 10/24/15 0317  Gross per 24 hour  Intake   1930 ml  Output    650 ml  Net   1280 ml   General: middle-aged African American male, sitting up in chair, no acute distress HEENT: PERRL, EOMI, no scleral icterus, oropharynx clear Cardiac: RRR, no rubs, murmurs or gallops Pulm: clear to auscultation bilaterally in the anterior lung fields, no wheezes, rales, or rhonchi Abd: soft, nontender, nondistended, BS present Ext: warm and well perfused, no pedal edema Neuro: responds to questions appropriately; moving all extremities freely, 5/5 upper and lower extremity strength, alert and oriented x 3  Lab Results: Basic Metabolic Panel:  Recent Labs Lab 10/22/15 0911 10/22/15 0913 10/23/15 0534  NA 137 139 137  K 3.7 3.6 3.8  CL 106 105 104  CO2 21*  --  22  GLUCOSE 75 71 70  BUN 16 18 17   CREATININE 1.51* 1.50* 1.52*  CALCIUM 8.9  --  8.5*   Liver Function Tests:  Recent Labs Lab 10/22/15 0911  AST 16  ALT 10*  ALKPHOS 81  BILITOT 0.5  PROT 7.2  ALBUMIN 3.2*   CBC:  Recent Labs Lab 10/22/15 0911 10/22/15 0913 10/23/15 0534  WBC 14.1*  --  6.6  NEUTROABS 10.1*  --   --   HGB 13.8 15.6 13.0  HCT 41.1 46.0 39.8  MCV 78.9  --  78.8  PLT 218  --  194   CBG:  Recent Labs Lab 10/22/15 2143 10/23/15 0621 10/23/15 1145 10/23/15 1647  10/23/15 2130 10/24/15 0620  GLUCAP 147* 78 154* 119* 123* 71   Fasting Lipid Panel:  Recent Labs Lab 10/23/15 0534  CHOL 195  HDL 25*  LDLCALC 130*  TRIG 199*  CHOLHDL 7.8   Coagulation:  Recent Labs Lab 10/22/15 0911  LABPROT 14.3  INR 1.09   Urine Drug Screen: Drugs of Abuse     Component Value Date/Time   LABOPIA NONE DETECTED 10/22/2015 1100   COCAINSCRNUR NONE DETECTED 10/22/2015 1100   LABBENZ NONE DETECTED 10/22/2015 1100   AMPHETMU NONE DETECTED 10/22/2015 1100   THCU NONE DETECTED 10/22/2015 1100   LABBARB NONE DETECTED 10/22/2015 1100    Alcohol Level:  Recent Labs Lab 10/22/15 0912  ETH <5   Urinalysis:  Recent Labs Lab 10/22/15 1040  COLORURINE YELLOW  LABSPEC 1.022  PHURINE 5.5  GLUCOSEU NEGATIVE  HGBUR MODERATE*  BILIRUBINUR NEGATIVE  KETONESUR NEGATIVE  PROTEINUR >300*  NITRITE NEGATIVE  LEUKOCYTESUR NEGATIVE   Studies/Results: Ct Angio Head W/cm &/or Wo Cm  10/22/2015  EXAM: CT ANGIOGRAPHY HEAD AND NECK TECHNIQUE: Multidetector CT imaging of the head and neck was performed using the standard protocol during bolus administration of intravenous contrast. Multiplanar  CT image reconstructions and MIPs were obtained to evaluate the vascular anatomy. Carotid stenosis measurements (when applicable) are obtained utilizing NASCET criteria, using the distal internal carotid diameter as the denominator. COMPARISON:  None. FINDINGS: CT HEAD Brain: No intracranial hemorrhage or CT evidence of large acute infarct. Small vessel disease changes. Limited for excluding small acute infarct given white matter changes. MR can be obtained for further delineation if clinically desired. No worrisome intracranial enhancing lesion. Global atrophy without hydrocephalus. Calvarium and skull base: Arachnoid granulations without destructive lesion. Paranasal sinuses: Dental disease with periapical lucency upper teeth without adjacent sinus disease. Mastoid air cells and  middle ear cavities are clear. Orbits: Minimal exophthalmos. CTA NECK Aortic arch: Common origin innominate and left common carotid artery. Plaque aortic arch and origin of great vessels with mild slightly moderate narrowing proximal left subclavian artery. Right carotid system: Calcified plaque carotid bifurcation with 48% diameter stenosis distal right common carotid artery. Calcified plaque proximal right internal carotid artery with less than 50% diameter stenosis. 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 This is flow limiting as the left internal carotid artery beyond this region is narrowed. Vertebral arteries:Left vertebral artery is slightly dominant size. Mild irregularity of the cervical segment of the vertebral arteries bilaterally. Skeleton: Dental disease. 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 Mild asymmetry palatine tonsils/ piriform sinus without mass identified. Upper chest: Chronic lung changes possibly with superimposed pulmonary vascular prominence. CTA HEAD Anterior circulation: No medium or large size vessel significant stenosis or occlusion. Middle cerebral artery branch vessel narrowing irregularity bilaterally. Plaque internal carotid artery cavernous segment without high-grade stenosis. There may be 2 tiny (1 mm) aneurysm of the right internal carotid artery cavernous segment. Fetal type contribution to the posterior cerebral arteries. Posterior circulation: Narrowed irregular distal vertebral arteries and basilar artery. Irregular narrowed posterior inferior cerebellar arteries, anterior inferior cerebral arteries and superior cerebellar arteries. Venous sinuses: Patent. Anatomic variants: As above. Delayed phase: As above. IMPRESSION: CT HEAD No intracranial hemorrhage or CT evidence of large acute  infarct. Small vessel disease changes. Global atrophy without hydrocephalus. CTA NECK Mild to slightly moderate narrowing proximal left subclavian artery. Calcified plaque right carotid bifurcation with 48% diameter stenosis distal right common carotid artery. Calcified plaque proximal right internal carotid artery with less than 50% diameter stenosis. 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 This is flow limiting as the left internal carotid artery beyond this region is narrowed. Left vertebral artery is slightly dominant size. Mild irregularity of the cervical segment of the vertebral arteries bilaterally. Dental disease. 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 Chronic lung changes possibly with superimposed pulmonary vascular prominence. CTA HEAD Anterior circulation without medium or large size vessel significant stenosis or occlusion. Middle cerebral artery branch vessel narrowing and irregularity bilaterally. Plaque internal carotid artery cavernous segment without high-grade stenosis. There may be 2 tiny (1 mm) aneurysms of the right internal carotid artery cavernous segment. Fetal type contribution to the posterior cerebral arteries. Irregular narrowed distal vertebral arteries and basilar artery. Irregular narrowed posterior inferior cerebellar arteries, anterior inferior cerebral arteries and superior cerebellar arteries. These results were called by telephone at the time of interpretation on 10/22/2015 at 9:42 am to Dr. Lavon Paganini who verbally acknowledged these results.  Electronically Signed   By: Lacy Duverney M.D.   On: 10/22/2015 10:22   Ct Head Wo Contrast  10/22/2015  CLINICAL DATA:  Code stroke.  New onset left-sided weakness. EXAM: CT HEAD WITHOUT CONTRAST TECHNIQUE: Contiguous axial images were obtained from the base of the skull through  the vertex without intravenous contrast. COMPARISON:  None. FINDINGS: No acute infarct, hemorrhage, or mass lesion is present. Mild generalized atrophy and scattered subcortical white matter hypoattenuation is advanced for age. The cortex is intact. The basal ganglia are intact. The insular ribbon is within normal limits. The brainstem and cerebellum are unremarkable. No significant extra-axial fluid collection is present. Atherosclerotic changes are present within the cavernous internal carotid arteries bilaterally. The paranasal sinuses and mastoid air cells are clear. The calvarium is intact. No significant extracranial soft tissue lesions are present. The globes and orbits are intact. ASPECTS score = 10/10 [place] Stroke Program Early CT Score Normal score = 10 IMPRESSION: 1. No acute intracranial abnormality. 2. Mild generalized atrophy and white matter disease is slightly advanced for age. This likely reflects the sequela of chronic microvascular ischemia. These results were called by telephone at the time of interpretation on 10/22/2015 at 9:22 am to Dr. Lavon Paganini , who verbally acknowledged these results. Electronically Signed   By: Marin Roberts M.D.   On: 10/22/2015 09:24   Ct Angio Neck W/cm &/or Wo/cm  10/22/2015  EXAM: CT ANGIOGRAPHY HEAD AND NECK TECHNIQUE: Multidetector CT imaging of the head and neck was performed using the standard protocol during bolus administration of intravenous contrast. Multiplanar CT image reconstructions and MIPs were obtained to evaluate the vascular anatomy. Carotid stenosis measurements (when applicable) are obtained utilizing NASCET criteria, using the distal internal carotid diameter as the denominator. COMPARISON:  None. FINDINGS: CT HEAD Brain: No intracranial hemorrhage or CT evidence of large acute infarct. Small vessel disease changes. Limited for excluding small acute infarct given white matter changes. MR can be obtained for further delineation if  clinically desired. No worrisome intracranial enhancing lesion. Global atrophy without hydrocephalus. Calvarium and skull base: Arachnoid granulations without destructive lesion. Paranasal sinuses: Dental disease with periapical lucency upper teeth without adjacent sinus disease. Mastoid air cells and middle ear cavities are clear. Orbits: Minimal exophthalmos. CTA NECK Aortic arch: Common origin innominate and left common carotid artery. Plaque aortic arch and origin of great vessels with mild slightly moderate narrowing proximal left subclavian artery. Right carotid system: Calcified plaque carotid bifurcation with 48% diameter stenosis distal right common carotid artery. Calcified plaque proximal right internal carotid artery with less than 50% diameter stenosis. 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 This is flow limiting as the left internal carotid artery beyond this region is narrowed. Vertebral arteries:Left vertebral artery is slightly dominant size. Mild irregularity of the cervical segment of the vertebral arteries bilaterally. Skeleton: Dental disease. 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 Mild asymmetry palatine tonsils/ piriform sinus without mass identified. Upper chest: Chronic lung changes possibly with superimposed pulmonary vascular prominence. CTA HEAD Anterior circulation: No medium or large size vessel significant stenosis or occlusion. Middle cerebral artery branch vessel narrowing irregularity bilaterally. Plaque internal carotid artery cavernous segment without high-grade stenosis. There may be 2 tiny (1 mm) aneurysm of the right internal carotid artery cavernous segment. Fetal type contribution to the posterior cerebral arteries. Posterior circulation: Narrowed irregular distal  vertebral arteries  and basilar artery. Irregular narrowed posterior inferior cerebellar arteries, anterior inferior cerebral arteries and superior cerebellar arteries. Venous sinuses: Patent. Anatomic variants: As above. Delayed phase: As above. IMPRESSION: CT HEAD No intracranial hemorrhage or CT evidence of large acute infarct. Small vessel disease changes. Global atrophy without hydrocephalus. CTA NECK Mild to slightly moderate narrowing proximal left subclavian artery. Calcified plaque right carotid bifurcation with 48% diameter stenosis distal right common carotid artery. Calcified plaque proximal right internal carotid artery with less than 50% diameter stenosis. 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 This is flow limiting as the left internal carotid artery beyond this region is narrowed. Left vertebral artery is slightly dominant size. Mild irregularity of the cervical segment of the vertebral arteries bilaterally. Dental disease. 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 Chronic lung changes possibly with superimposed pulmonary vascular prominence. CTA HEAD Anterior circulation without medium or large size vessel significant stenosis or occlusion. Middle cerebral artery branch vessel narrowing and irregularity bilaterally. Plaque internal carotid artery cavernous segment without high-grade stenosis. There may be 2 tiny (1 mm) aneurysms of the right internal carotid artery cavernous segment. Fetal type contribution to the posterior cerebral arteries. Irregular narrowed distal vertebral arteries and basilar artery. Irregular narrowed posterior inferior cerebellar arteries, anterior inferior cerebral arteries and superior cerebellar arteries. These results were called by telephone at the time of interpretation on 10/22/2015 at 9:42 am to Dr. Lavon Paganini who verbally  acknowledged these results. Electronically Signed   By: Lacy Duverney M.D.   On: 10/22/2015 10:22   Medications: I have reviewed the patient's current medications. Scheduled Meds: . aspirin  81 mg Oral Daily  . atorvastatin  40 mg Oral q1800  . clopidogrel  75 mg Oral Daily  . enoxaparin (LOVENOX) injection  40 mg Subcutaneous Q24H  . hydrochlorothiazide  25 mg Oral Daily  . insulin aspart  0-5 Units Subcutaneous QHS  . insulin aspart  0-9 Units Subcutaneous TID WC  . insulin glargine  20 Units Subcutaneous QHS  . nicotine  14 mg Transdermal Daily  . sodium chloride flush  3 mL Intravenous Q12H  . traMADol  50-100 mg Oral 4 times per day   Continuous Infusions:  PRN Meds:.labetalol Assessment/Plan: Active Problems:   Weakness   Stroke (HCC)   TIA (transient ischemic attack)  Lance Duncan is a 65 year old male with insulin-dependent diabetes, hypertension, chronic low back pain, presumed CKD Stage III hospitalized for TIA.  TIA: Weakness resolved on exam this morning. 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 PT/OT recommending CIR thought unclear if patient will have to be self-pay. Echo with EF 65-70% with grade 1 diastolic dysfunction. Vascular Surgery recommending additional workup with carotid dopplers. -Follow-up carotid dopplers -Repeat head CT to reassess for findings of CVA -Consult CIR or order home health with PT/OT pending further recs -Continue ASA  and Plavix  per Neurology -Counsel on smoking cessation and continue nicotine replacement therapy  Hypertension: BP trending 180s-190s/70s-80s. Home medications include amlodipine , lisinopril , HCTZ . -Resume lisinopril today  -Continue labetalol  IV every 2 hours as needed for >200/110  Insulin-dependent Type 2 diabetes: A1c 8.3. Home medications include Lantus 30u QHS. CBGs trending low to mid 100s yesterday though now 71 this morning. -Continue  Lantus 20u QHS with sensitive sliding scale insulin but will likely resume  his home regimen at discharge -Suggest to PCP considerations for metformin to aid in his diabetic control  CKD Stage 3: Baseline Crt appears to be around 1.5 which correlates with GFR 50s.  Low back pain: Continue home tramadol 50-100mg  every 6 hours as needed for low back pain.  #FEN:  -Diet: Carb Modified  #DVT prophylaxis: Lovenox  #CODE STATUS: FULL CODE  Dispo: Disposition is deferred at this time, awaiting improvement of current medical problems.  Anticipated discharge in approximately 1-2 day(s).   The patient does have a current PCP Vernie Ammons([facility]) and [facility] after discharge.  The patient does not know have transportation limitations that hinder transportation to clinic appointments.  .Services Needed at time of discharge: Y = Yes, Blank = No PT:   OT:   RN:   Equipment:   Other:       Beather Arbourushil Getsemani Lindon V, MD 10/24/2015, 7:13 AM

## 2015-10-24 NOTE — Progress Notes (Signed)
STROKE TEAM PROGRESS NOTE   HISTORY OF PRESENT ILLNESS Lance Duncan is an 65 y.o. male patient who was brought into the emergency room , with left sided weakness symptoms, onset at 4 am 10/22/15. He arrived at 9.15 am in ER. Denies any vision sx, no new numbness. He was up all night due to insomnia. Patient lives alone. He does not have any immediate family in the area. NIHSS 3. Patient was not administered IV t-PA secondary to being outside the time window. He was admitted for further evaluation and treatment.   SUBJECTIVE (INTERVAL HISTORY) No family is at the bedside.  Overall he feels his condition is improved much with minimal facial weakness and left side weakness    OBJECTIVE Temp:  [97.5 F (36.4 C)-98.8 F (37.1 C)] 97.5 F (36.4 C) (04/19 0956) Pulse Rate:  [57-61] 61 (04/19 0956) Cardiac Rhythm:  [-] Sinus bradycardia (04/19 0700) Resp:  [18-20] 20 (04/19 0956) BP: (176-195)/(66-85) 180/69 mmHg (04/19 1400) SpO2:  [95 %-100 %] 95 % (04/19 0956)  CBC:   Recent Labs Lab 10/22/15 0911 10/22/15 0913 10/23/15 0534  WBC 14.1*  --  6.6  NEUTROABS 10.1*  --   --   HGB 13.8 15.6 13.0  HCT 41.1 46.0 39.8  MCV 78.9  --  78.8  PLT 218  --  194    Basic Metabolic Panel:   Recent Labs Lab 10/22/15 0911 10/22/15 0913 10/23/15 0534  NA 137 139 137  K 3.7 3.6 3.8  CL 106 105 104  CO2 21*  --  22  GLUCOSE 75 71 70  BUN CREATININE 1.51* 1.50* 1.52*  CALCIUM 8.9  --  8.5*    Lipid Panel:     Component Value Date/Time   CHOL 195 10/23/2015 0534   TRIG 199* 10/23/2015 0534   HDL 25* 10/23/2015 0534   CHOLHDL 7.8 10/23/2015 0534   VLDL 40 10/23/2015 0534   LDLCALC 130* 10/23/2015 0534   HgbA1c:  Lab Results  Component Value Date   HGBA1C 8.3* 10/23/2015   Urine Drug Screen:     Component Value Date/Time   LABOPIA NONE DETECTED 10/22/2015 1100   COCAINSCRNUR NONE DETECTED 10/22/2015 1100   LABBENZ NONE DETECTED 10/22/2015 1100   AMPHETMU  NONE DETECTED 10/22/2015 1100   THCU NONE DETECTED 10/22/2015 1100   LABBARB NONE DETECTED 10/22/2015 1100      IMAGING  Ct Head Wo Contrast 10/22/2015  1. No acute intracranial abnormality. 2. Mild generalized atrophy and white matter disease is slightly advanced for age. This likely reflects the sequela of chronic microvascular ischemia.   CT HEAD  10/22/2015  No intracranial hemorrhage or CT evidence of large acute infarct. Small vessel disease changes. Global atrophy without hydrocephalus.   CTA NECK  10/22/2015  Mild to slightly moderate narrowing proximal left subclavian artery. Calcified plaque right carotid bifurcation with 48% diameter stenosis distal right common carotid artery. Calcified plaque proximal right internal carotid artery with less than 50% diameter stenosis. 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 This is flow limiting as the left internal carotid artery beyond this region is narrowed. Left vertebral artery is slightly dominant size. Mild irregularity of the cervical segment of the vertebral arteries bilaterally. Dental disease. 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 Chronic lung changes possibly with superimposed pulmonary vascular prominence.   CTA HEAD  10/22/2015  Anterior circulation without medium or large size vessel significant stenosis or occlusion. Middle cerebral artery branch vessel narrowing and irregularity bilaterally. Plaque internal carotid artery cavernous segment without high-grade stenosis. There may be 2 tiny (1 mm) aneurysms of the right internal carotid artery cavernous segment. Fetal type contribution to the posterior cerebral arteries. Irregular narrowed distal vertebral arteries and basilar artery. Irregular narrowed posterior inferior cerebellar arteries, anterior inferior cerebral arteries and  superior cerebellar arteries.   Cerebral angiogram 10/22/2015 1. 95 % plus pre occlusive stenosis of LT ICA prox. 2.Non opacification of LT ACA prox. 3.Approx 50 % stenosis of Lt ICA petrous Cavernous segment. 4.approx 50 % stenosis of LT VA prox   PHYSICAL EXAM Obese middle-aged African-American male not in distress . Marland Kitchen. Afebrile. Head is nontraumatic. Neck is supple without bruit.    Cardiac exam no murmur or gallop. Lungs are clear to auscultation. Distal pulses are well felt. Neurological Exam :  Awake alert oriented x 3 normal speech and language. Mild left lower face asymmetry. Tongue midline. No drift. Mild diminished fine finger movements on left. Orbits right over left upper extremity. Mild left grip weak.. Normal sensation . Normal coordination. ASSESSMENT/PLAN [name] is a 65 y.o. male with history of hypertension, diabetes and chronic back pain presenting with left sided weakness. He did not receive IV t-PA due to being outside the window.   Stroke:  Presume R brain infarct not seen on initial CT given ongoing L hemiparesis, source unclear at this time though suspect a small subcortical infarct. Workup underway  Resultant  L hemiparesis, L facial droop  MRI  Unable to be performed due to penile implant (need clearance prior to MRI)  CTA head  No significant stenosis. Diffused atherosclerosis. ? Tiny (1mm) R ICA aneurysm  CTA neck  L SCA narrowing. R CCA 48%. R ICA < 50%. L CCA with string sign. B cervical irregularity. Dental disease. Cervical disease C3-4. Goiter with L substernal extension. Chronic lung changes.  Cerebral angio L CCA 95% stenosis (string sign), L petrous 50% stenosis. L VA 50% stenosis  Recommend repeat CT after 24h to confirm/refute stroke 2D Echo  Left ventricle: The cavity size was normal. There was mild  concentric hypertrophy. Systolic function was vigorous. The  estimated ejection fraction was in the range of 65% to 70%. Wall   motion was normal; there were no regional wall motion   abnormalities  LDL 130  HgbA1c 8.3  Lovenox 40 mg sq daily for VTE prophylaxis Diet heart healthy/carb modified Room service appropriate?: Yes; Fluid consistency:: Thin  No antithrombotic prior to admission, given large vessel disease was placed on aspirin 325 mg daily and clopidogrel 75 mg daily. For now, recommend dual antiplatelets x 3 months then plavix alone - defer to [facility]   Patient counseled to be compliant with his antithrombotic medications  Ongoing aggressive stroke risk factor management  Therapy recommendations:  CIR  Advised to exercise. Pool exercise may be appropriate given back pain  Disposition:  pending   [facility]  Carotid stenosis  L ICA string sign  Incidental finding  Recommend [facility] consult for surgery in the future  Hypertension  elevated Permissive hypertension (OK if < 220/120) but gradually normalize in 5-7 days Given carotid stenosis, would not lower aggressively  Hyperlipidemia  Home meds:  No statin  LDL 130, goal < 70  Add statin   Diabetes type II  HgbA1c pending, goal < 7.0  Hypoglycemia during the  night, 44, treated with OJ  Other Stroke Risk Factors  Cigarette smoker, advised to stop smoking  Occasional ETOH use  Obesity, Body mass index is 35.43 kg/(m^2).   Family hx stroke (mother, brother)  Possible Obstructive sleep apnea (study 10 years ago), snores, does not use CPAP at home  Other Active Problems  AKI vs CKD  Has penile implant  On D5NS at 75/h - in general, do not recommend use of dextrose containing IVF in acute stroke patients.  Dr. Pearlean Brownie called and discussed case with Medical resident. Dr [facility] day #    I have personally examined this patient, reviewed notes, independently viewed imaging studies, participated in medical decision making and plan of care. I have made any additions or clarifications directly to the above  note. Agree with note above.  He presented with left-sided weakness likely due to a right brain TIA. He remains at risk for recurrent stroke, TIA, neurological worsening and needs ongoing stroke evaluation. He also had incidental very tight left extracranial carotid stenosis and needs  elective revascularization for stroke prevention.Await vascular surgery's decision on timing of surgery prior to being discharged home. Follow up as outpatient in stroke clinic in 2 months. Delia Heady, MD Medical Director [facility] Pager: [phone] 10/24/2015 3:30 PM    To contact Stroke Continuity provider, please refer to [email or web address]. After hours, contact General Neurology

## 2015-10-24 NOTE — Progress Notes (Signed)
Patient interviewed with PT in the room--he is approaching independent level and outpatient therapy recommended. He is not willing to pay out of pocket for CIR and [facility].  Discussed that he is doing well and is too high level for CIR. MD on acute to order medications and therapy. Will defer CIR consult.

## 2015-10-24 NOTE — Progress Notes (Signed)
Occupational Therapy Treatment Patient Details Name: Lance Duncan MRN: [id number] DOB: 11-22-1950 Today's Date: 10/24/2015    History of present illness Lance Duncan is a 65 year old male with a past medical history significant for HTN, T2DM, and chronic low back pain who presented to the ED due to a several hour history of left sided weakness. He states that he was having trouble sleeping early this morning due to his back pain and tried to sleep in his recliner. Around 4 am, he noticed that his left side was numb and he was unable to move his left arm. When he tried to stand from his recliner to use the restroom, he could barely walk due to weakness of his left leg. He was able to make it to the restroom, but fell headfirst into the bathtub due to weakness.   OT comments  Pt making good progress toward OT goals this session. Reports improved strength and sensation in L UE/LE. Pt able to perform UB/LB dressing and tub transfer with supervision for safety. Educated on home safety, energy conservation, and signs/symptoms of stroke. Upgraded d/c plan to no f/u OT. Will continue to follow acutely.   Follow Up Recommendations  No OT follow up;Supervision - Intermittent    Equipment Recommendations  None recommended by OT    Recommendations for Other Services      Precautions / Restrictions Precautions Precautions: Fall Restrictions Weight Bearing Restrictions: No       Mobility Bed Mobility               General bed mobility comments: Pt up in room upon arrival.  Transfers Overall transfer level: Needs assistance Equipment used: None Transfers: Sit to/from Stand Sit to Stand: Supervision         General transfer comment: Supervision for safety    Balance Overall balance assessment: Needs assistance         Standing balance support: No upper extremity supported;During functional activity Standing balance-Leahy Scale: Good                     ADL  Overall ADL's : Needs assistance/impaired                 Upper Body Dressing : Supervision/safety;Standing   Lower Body Dressing: Supervision/safety;Sit to/from stand           Tub/ Shower Transfer: Supervision/safety;Tub transfer;Ambulation   Functional mobility during ADLs: Supervision/safety General ADL Comments: Reviewed home safety, signs/symptoms of stroke, fall prevention strategies. Pt reports improvements in L UE/LE strength and sensation.      Vision                     Perception     Praxis      Cognition   Behavior During Therapy: WFL for tasks assessed/performed Overall Cognitive Status: Within Functional Limits for tasks assessed                       Extremity/Trunk Assessment               Exercises     Shoulder Instructions       General Comments      Pertinent Vitals/ Pain       Pain Assessment: No/denies pain  Home Living  Prior Functioning/Environment              Frequency Min 2X/week     Progress Toward Goals  OT Goals(current goals can now be found in the care plan section)  Progress towards OT goals: Progressing toward goals  Acute Rehab OT Goals Patient Stated Goal: go home  Plan Discharge plan needs to be updated    Co-evaluation                 End of Session     Activity Tolerance Patient tolerated treatment well   Patient Left Other (comment) (sitting EOB)   Nurse Communication Other (comment) (pt with question regarding nicotine patch)        Time: 1459-1510 OT Time Calculation (min): 11 min  Charges: OT General Charges $OT Visit: 1 Procedure OT Treatments $Self Care/Home Management : 8-22 mins  Gaye AlkenBailey A Dieter Hane M.S., OTR/L Pager: [phone]  10/24/2015, 4:44 PM

## 2015-10-24 NOTE — Progress Notes (Signed)
VASCULAR LAB PRELIMINARY  PRELIMINARY  PRELIMINARY  PRELIMINARY  Carotid duplex completed.    Preliminary report:  Right :  40-59% internal carotid artery stenosis. Left - 80% to 99% ICA stenosis.  Bilateral - Vertebral artery flow is antegrade.  Preliminary report called to Dr. Waverly Ferrarihristopher Dickson-  10/24/2015 2:07 PM  Elesa Garman, RVS 10/24/2015, 2:07 PM

## 2015-10-25 ENCOUNTER — Other Ambulatory Visit: Payer: Self-pay

## 2015-10-29 NOTE — Progress Notes (Addendum)
Anesthesia Note: Patient is a 65 year old male scheduled for left carotid endarterectomy on 11/06/15 by Dr. Edilia Boickson. [facility] with left sided weakness X 5+ hours. Head CT showed no acute abnormality (unable to do MRI due to penile implant); however, on CTA there was string sign of the left carotid bifurcation which was confirmed on carotid U/S and cerebral angiogram (although would not explain his left sided hemiparesis). Patient was seen by neurology (Dr. Pearlean BrownieSethi) and diagnosed with TIA. He was started on ASA and Plavix with plans for left CEA in the near future. Dr. Edilia Boickson would not consider right CEA unless patient had another right hemispheric event on ASA and Plavix.  In Dr. Adele Danickson's 10/23/15 consult note, he wrote that patient "does admit to some occasional chest pain but thinks it might be indigestion. Given that he will likely require elective carotid endarterectomy on the left I think it would be worth having cardiology evaluate the patient preoperatively." Unfortunately, it doesn't look like out-patient cardiology referral was arranged following his discharge on 10/24/15. He reportedly was [facility] and has been approved for surgery by [facility]. Records are pending.     History includes smoking, HTN, DM2, hepatitis C (s/p "curative" treatment), arthritis, chronic low back pain, penile prosthesis implant, right orchiectomy (benign), TIA 10/2015, carotid artery stenosis, renal insufficiency (Cr 1.50 10/2015). 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 BMI is 33 consistent with obesity.   Meds include ASA 81 mg, Plavix, Lipitor, HCTZ, hydralazine, Cosopt and Xalatan ophthalmic, Lantus, lisinopril, Nicoderm CQ and Nicorette gum, tramadol. He is to continue ASA and Plavix perioperative per Dr.  Edilia Boickson.  PAT Vitals: BP 206/86 (machine). BP 208/90 LUE (manual), BP 210/84 RUE (manual). HR 84, RR 20, O2 sat 96%, T 36.8C. CBG 322 (non-fasting). Patient reports he took am BP meds, but is due for another hydralazine dose once he gets home (takes TID). He also took 64 units of Lantus this morning. He last ate around 12N. He denied headaches, visual changes, chest pain. No conversational dyspnea noted. He feels tired over the past several days. Reports his left sided weakness has improved, although he is intermittently using a cane. His home SBPs have been ~ 170-200. He was seen by the nurse at Dr. Lars PinksKeistler's office this morning for a BP recheck and reportedly had a SBP of 203 on arrival, but was down to 158/70-80's when he left and no medication changes were made. He reports his fasting CBGs run ~ 70-120's. He denied any chest pain since his hospitalization, and when asked if he had chest pain prior to that he said he couldn't really recall. He does report chronic back pain.  10/22/15 EKG: SR, anterior infarct (age undetermined). No comparison tracing in Epic or Muse.   10/23/15 Echo: Study Conclusions - Left ventricle: The cavity size was normal. There was mild  concentric hypertrophy. Systolic function was vigorous. The  estimated ejection fraction was in the range of 65% to 70%. Wall  motion was normal; there were no regional wall motion  abnormalities. Doppler parameters are consistent with abnormal  left ventricular relaxation (grade 1 diastolic dysfunction). - Aortic valve: Trileaflet; mildly thickened, mildly calcified  leaflets. Impressions: - No cardiac source of emboli was indentified.  10/24/15 Carotid U/S: Summary: - Right - 40% to 59% ICA  stenosis. - Left - 80% to 99% ICA stenosis. String sign in the proximal ICA  with severe acoustic shadowing. - Bilateral - Vertebral artery flow is antegrade.  10/22/15 Cerebral angiogram: 1. 95 % plus pre occlusive stenosis of LT ICA  prox. 2.Non opacification of LT ACA prox. 3.Approx 50 % stenosis of Lt ICA petrous Cavernous segment. 4.approx 50 % stenosis of LT VA prox  10/22/15 CTA head/neck: IMPRESSION: CT HEAD - No intracranial hemorrhage or CT evidence of large acute infarct. - Small vessel disease changes. - Global atrophy without hydrocephalus. CTA NECK - Mild to slightly moderate narrowing proximal left subclavian artery. - Calcified plaque right carotid bifurcation with 48% diameter stenosis distal right common carotid artery. - Calcified plaque proximal right internal carotid artery with less than 50% diameter stenosis. - Plaque left carotid bifurcation with almost complete occlusion of the proximal left internal carotid artery with string sign. This is flow limiting as the left internal carotid artery beyond this region is narrowed. - Left vertebral artery is slightly dominant size. Mild irregularity of the cervical segment of the vertebral arteries bilaterally. - Dental disease. - Cervical spondylotic changes with spinal stenosis most notable C3-4. 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 - Chronic lung changes possibly with superimposed pulmonary vascular prominence. CTA HEAD - Anterior circulation without medium or large size vessel significant stenosis or occlusion. - Middle cerebral artery branch vessel narrowing and irregularity bilaterally. - Plaque internal carotid artery cavernous segment without high-grade stenosis. - There may be 2 tiny (1 mm) aneurysms of the right internal carotid artery cavernous segment. - Fetal type contribution to the posterior cerebral arteries. - Irregular narrowed distal vertebral arteries and basilar artery. - Irregular narrowed posterior inferior cerebellar arteries, anterior inferior cerebral arteries and superior cerebellar arteries. - These results were called by telephone at the time of interpretation on 10/22/2015 at 9:42 am to Dr. Lavon Paganini  who verbally acknowledged these results.  Preoperative labs noted. BUN 32, Cr 1.85 (up from 1.50 range ~ 10 days ago). Glucose 337. A1c on 11/02/15 was 8.3 consistent with a mean plasma glucose of 192. H/H 14.1/43.7. PT/PTT WNL. T&S done. UA negative for leukocytes and nitrites.   Will request [facility] hospital follow-up note. Chart previously reviewed with anesthesiologist Dr. Noreene Larsson on 10/29/15 who had recommended cardiology pre-operative evaluation based on Dr. Adele Dan note and patient's multiple CAD risk factors. Unfortunately, this has not been arranged yet. Dr. Noreene Larsson did evaluate patient in PAT this afternoon. He and Dr. Edilia Bo were both notified of CBG and BP results. Again pre-operative cardiology consult recommended. [facility] RN Joyce Gross notified. I asked that if Dr. Edilia Bo felt surgery could not be safely delayed then to have him call and speak with Dr. Noreene Larsson. She reviewed with Dr. Edilia Bo who is in agreement with cardiology consult, particularly with poorly controlled BP--however, he feels evaluation and surgery would need to ideally occur within the next two weeks due to the severity of his carotid stenosis on the left.  Joyce Gross will contact [facility] on Monday to discuss arranging cardiology referral and will let patient know if and when his surgery would need to be postponed. Of note, I did discuss with patient that during his recent hospitalization his Cr was elevated (unsure of baseline) and that he had an enlarged thyroid on CT, and recommended he discuss with Dr. Jerry Caras further recommendations for future follow-up.   Velna Ochs [facility] Short Stay Center/Anesthesiology Phone [phone] 11/02/2015 6:11 PM

## 2015-11-02 ENCOUNTER — Encounter (HOSPITAL_COMMUNITY): Payer: Self-pay

## 2015-11-02 ENCOUNTER — Encounter (HOSPITAL_COMMUNITY)
Admission: RE | Admit: 2015-11-02 | Discharge: 2015-11-02 | Disposition: A | Discharge status: Home / Self Care | Payer: Non-veteran care | Source: Ambulatory Visit | Attending: Vascular Surgery | Admitting: Vascular Surgery

## 2015-11-02 DIAGNOSIS — Z01812 Encounter for preprocedural laboratory examination: Secondary | ICD-10-CM | POA: Insufficient documentation

## 2015-11-02 DIAGNOSIS — Z0183 Encounter for blood typing: Secondary | ICD-10-CM | POA: Insufficient documentation

## 2015-11-02 DIAGNOSIS — I6522 Occlusion and stenosis of left carotid artery: Secondary | ICD-10-CM | POA: Insufficient documentation

## 2015-11-02 LAB — COMPREHENSIVE METABOLIC PANEL
ALT: 14 U/L — ABNORMAL LOW (ref 17–63)
ANION GAP: 10 (ref 5–15)
AST: 19 U/L (ref 15–41)
Albumin: 3.2 g/dL — ABNORMAL LOW (ref 3.5–5.0)
Alkaline Phosphatase: 81 U/L (ref 38–126)
BILIRUBIN TOTAL: 0.3 mg/dL (ref 0.3–1.2)
BUN: 32 mg/dL — AB (ref 6–20)
CHLORIDE: 100 mmol/L — AB (ref 101–111)
CO2: 25 mmol/L (ref 22–32)
Calcium: 9 mg/dL (ref 8.9–10.3)
Creatinine, Ser: 1.85 mg/dL — ABNORMAL HIGH (ref 0.61–1.24)
GFR, EST AFRICAN AMERICAN: 43 mL/min — AB (ref >60)
GFR, EST NON AFRICAN AMERICAN: 37 mL/min — AB (ref >60)
Glucose, Bld: 337 mg/dL — ABNORMAL HIGH (ref 65–99)
POTASSIUM: 4.6 mmol/L (ref 3.5–5.1)
SODIUM: 135 mmol/L (ref 135–145)
TOTAL PROTEIN: 7.4 g/dL (ref 6.5–8.1)

## 2015-11-02 LAB — CBC
HCT: 43.7 % (ref 39.0–52.0)
Hemoglobin: 14.1 g/dL (ref 13.0–17.0)
MCH: 25.9 pg — ABNORMAL LOW (ref 26.0–34.0)
MCHC: 32.3 g/dL (ref 30.0–36.0)
MCV: 80.3 fL (ref 78.0–100.0)
PLATELETS: 307 10*3/uL (ref 150–400)
RBC: 5.44 MIL/uL (ref 4.22–5.81)
RDW: 14.3 % (ref 11.5–15.5)
WBC: 9.9 10*3/uL (ref 4.0–10.5)

## 2015-11-02 LAB — URINALYSIS, ROUTINE W REFLEX MICROSCOPIC
BILIRUBIN URINE: NEGATIVE
GLUCOSE, UA: 100 mg/dL — AB
Hgb urine dipstick: NEGATIVE
Ketones, ur: NEGATIVE mg/dL
LEUKOCYTES UA: NEGATIVE
NITRITE: NEGATIVE
PH: 6 (ref 5.0–8.0)
Protein, ur: >300 mg/dL — AB
SPECIFIC GRAVITY, URINE: 1.017 (ref 1.005–1.030)

## 2015-11-02 LAB — TYPE AND SCREEN
ABO/RH(D): O POS
ANTIBODY SCREEN: NEGATIVE

## 2015-11-02 LAB — ABO/RH: ABO/RH(D): O POS

## 2015-11-02 LAB — APTT: aPTT: 37 seconds (ref 24–37)

## 2015-11-02 LAB — SURGICAL PCR SCREEN
MRSA, PCR: NEGATIVE
STAPHYLOCOCCUS AUREUS: NEGATIVE

## 2015-11-02 LAB — URINE MICROSCOPIC-ADD ON: RBC / HPF: NONE SEEN RBC/hpf (ref 0–5)

## 2015-11-02 LAB — GLUCOSE, CAPILLARY: Glucose-Capillary: 322 mg/dL — ABNORMAL HIGH (ref 65–99)

## 2015-11-02 LAB — PROTIME-INR
INR: 1.11 (ref 0.00–1.49)
PROTHROMBIN TIME: 14.5 s (ref 11.6–15.2)

## 2015-11-02 NOTE — Pre-Procedure Instructions (Signed)
Reed PandyRobert Henner  11/02/2015     No Pharmacies Listed   Your procedure is scheduled on Nov 06, 2015.  [facility].M.  Call this number if you have problems the morning of surgery:  [phone]   Remember:  Do not eat food or drink liquids after midnight.  Take these medicines the morning of surgery with A SIP OF WATER : hydrALAZINE (APRESOLINE), IF NEEDED- traMADol (ULTRAM)   PLAVIX AS DIRECTED     How to Manage Your Diabetes Before and After Surgery  Why is it important to control my blood sugar before and after surgery? . Improving blood sugar levels before and after surgery helps healing and can limit problems. . A way of improving blood sugar control is eating a healthy diet by: o  Eating less sugar and carbohydrates o  Increasing activity/exercise o  Talking with your doctor about reaching your blood sugar goals . High blood sugars (greater than 180 mg/dL) can raise your risk of infections and slow your recovery, so you will need to focus on controlling your diabetes during the weeks before surgery. . Make sure that the doctor who takes care of your diabetes knows about your planned surgery including the date and location.  How do I manage my blood sugar before surgery? . Check your blood sugar at least 4 times a day, starting 2 days before surgery, to make sure that the level is not too high or low. o Check your blood sugar the morning of your surgery when you wake up and every 2 hours until you get to the Short Stay unit. . If your blood sugar is less than 70 mg/dL, you will need to treat for low blood sugar: o Do not take insulin. o Treat a low blood sugar (less than 70 mg/dL) with  cup of clear juice (cranberry or apple), 4 glucose tablets, OR glucose gel. o Recheck blood sugar in 15 minutes after treatment (to make sure it is greater than 70 mg/dL). If your blood sugar is not greater than 70 mg/dL on recheck, call  [phone] for further instructions. . Report your blood sugar to the short stay nurse when you get to Short Stay.  . If you are admitted to the hospital after surgery: o Your blood sugar will be checked by the staff and you will probably be given insulin after surgery (instead of oral diabetes medicines) to make sure you have good blood sugar levels. o The goal for blood sugar control after surgery is 80-180 mg/dL.    WHAT DO I DO ABOUT MY DIABETES MEDICATIONS  . Do not take oral diabetes medicines (pills) the morning of surgery.  . THE NIGHT BEFORE SURGERY, take ____37____ units of ___LANTUS___insulin.       Marland Kitchen. HE MORNING OF SURGERY, take ____32______ units of __LANTUS______insulin.  . The day of surgery, do not take other diabetes injectables, including Byetta (exenatide), Bydureon (exenatide ER), Victoza (liraglutide), or Trulicity (dulaglutide).     Do not wear jewelry, make-up or nail polish.  Do not wear lotions, powders, or perfumes.  You may wear deodorant.  Do not shave 48 hours prior to surgery.  Men may shave face and neck.  Do not bring valuables to the hospital.  [facility] is not responsible for any belongings or valuables.  Contacts, dentures or bridgework may not be worn into surgery.  Leave your suitcase in the car.  After surgery it may be brought  to your room.  For patients admitted to the hospital, discharge time will be determined by your treatment team.  Name and phone number of your driver:    Special instructions:  "PREPARING FOR SURGERY"  Please read over the following fact sheets that you were given. Pain Booklet, Coughing and Deep Breathing, Blood Transfusion Information and Surgical Site Infection Prevention

## 2015-11-08 ENCOUNTER — Other Ambulatory Visit (HOSPITAL_COMMUNITY): Payer: Self-pay | Admitting: Interventional Radiology

## 2015-11-08 DIAGNOSIS — I6320 Cerebral infarction due to unspecified occlusion or stenosis of unspecified precerebral arteries: Secondary | ICD-10-CM

## 2015-11-09 ENCOUNTER — Other Ambulatory Visit: Payer: Self-pay

## 2015-11-19 ENCOUNTER — Encounter (HOSPITAL_COMMUNITY): Payer: Self-pay | Admitting: *Deleted

## 2015-11-19 NOTE — Progress Notes (Signed)
Anesthesia Follow-up: SAME DAY WORK-UP.  Patient was scheduled for left carotid endarterectomy on 11/06/15 by Dr. Edilia Boickson for asymptomatic severe left ICA stenosis. He also has moderate right ICA stenosis with recent right brain TIA 10/2015. Surgery was postponed to allow time for cardiology evaluation. Also BP and glucose were poorly controlled at his PAT visit (A1c was 8.3 on 10/23/15). See my note from 11/02/15.   On 11/12/15, he was evaluated by Dr. Merrily Pewarryl Kalil [phone]) for pre-operative cardiology evaluation. Currently, I do not have the office note, but he did order a stress test which was non-ischemic (see below).  11/15/15 Nuclear stress test [facility], Care Everywhere): Conclusions Summary: Well tolerated Lexiscan perfusion stress test, with no clinical ischemia during vasodilator stress. There is normal isotope uptake following Lexiscan injection and at rest. There is no evidence of ischemia. Normal LV function with EF of 50 %.  I never received additional [facility]. He will get vitals and labs on arrival to PAT. Further evaluation by his surgeon and anesthesiologist at that time to determine the definitive anesthesia plan.  Lance Duncan Lance Antkowiak, PA-C [facility] Short Stay [facility]/Anesthesiology Phone [phone] 11/19/2015 1:32 PM

## 2015-11-19 NOTE — Progress Notes (Signed)
Pt's surgery was rescheduled due to needing to see cardiologist. He states he saw Dr. Hannah BeatKahlil last week, had a stress test and was told he was ok'ed for surgery. Pt denies any recent chest pain or sob. Pt states his blood pressure has been between 178/80 and 190/89. States fasting blood sugar has been running between 50-70's recently. Pt instructed to take 1/2 of his regular dose of Lantus (will be 37 units) this PM and 1/2 of regular dose of Lantus in the AM (will be 33 units). Instructed pt to check his blood sugar in the AM when wakes up. If blood sugar is 70 or below, treat with 1/2 cup of clear juice (apple or cranberry) and recheck blood sugar 15 minutes after drinking juice.   Stress - 11/15/15 - in Care Everywhere Echo - 10/23/15 - in Epic EKG - 10/22/15 - in Epic

## 2015-11-20 ENCOUNTER — Inpatient Hospital Stay (HOSPITAL_COMMUNITY): Payer: No Typology Code available for payment source | Admitting: Vascular Surgery

## 2015-11-20 ENCOUNTER — Encounter (HOSPITAL_COMMUNITY)
Admission: RE | Disposition: A | Discharge status: Home / Self Care | Payer: Self-pay | Source: Ambulatory Visit | Attending: Vascular Surgery

## 2015-11-20 ENCOUNTER — Inpatient Hospital Stay (HOSPITAL_COMMUNITY)
Admission: RE | Admit: 2015-11-20 | Discharge: 2015-11-23 | DRG: 039 | Disposition: A | Discharge status: Home / Self Care | Payer: No Typology Code available for payment source | Source: Ambulatory Visit | Attending: Vascular Surgery | Admitting: Vascular Surgery

## 2015-11-20 ENCOUNTER — Telehealth: Payer: Self-pay | Admitting: Vascular Surgery

## 2015-11-20 ENCOUNTER — Encounter (HOSPITAL_COMMUNITY): Payer: Self-pay | Admitting: Anesthesiology

## 2015-11-20 DIAGNOSIS — Z79899 Other long term (current) drug therapy: Secondary | ICD-10-CM

## 2015-11-20 DIAGNOSIS — M1990 Unspecified osteoarthritis, unspecified site: Secondary | ICD-10-CM | POA: Diagnosis present

## 2015-11-20 DIAGNOSIS — I6523 Occlusion and stenosis of bilateral carotid arteries: Principal | ICD-10-CM | POA: Diagnosis present

## 2015-11-20 DIAGNOSIS — Z720 Tobacco use: Secondary | ICD-10-CM | POA: Diagnosis present

## 2015-11-20 DIAGNOSIS — N400 Enlarged prostate without lower urinary tract symptoms: Secondary | ICD-10-CM | POA: Diagnosis present

## 2015-11-20 DIAGNOSIS — E118 Type 2 diabetes mellitus with unspecified complications: Secondary | ICD-10-CM | POA: Diagnosis not present

## 2015-11-20 DIAGNOSIS — F17210 Nicotine dependence, cigarettes, uncomplicated: Secondary | ICD-10-CM | POA: Diagnosis present

## 2015-11-20 DIAGNOSIS — G8929 Other chronic pain: Secondary | ICD-10-CM | POA: Diagnosis present

## 2015-11-20 DIAGNOSIS — E049 Nontoxic goiter, unspecified: Secondary | ICD-10-CM | POA: Diagnosis present

## 2015-11-20 DIAGNOSIS — E1165 Type 2 diabetes mellitus with hyperglycemia: Secondary | ICD-10-CM | POA: Diagnosis present

## 2015-11-20 DIAGNOSIS — E669 Obesity, unspecified: Secondary | ICD-10-CM | POA: Diagnosis present

## 2015-11-20 DIAGNOSIS — Z7982 Long term (current) use of aspirin: Secondary | ICD-10-CM

## 2015-11-20 DIAGNOSIS — Z6833 Body mass index (BMI) 33.0-33.9, adult: Secondary | ICD-10-CM

## 2015-11-20 DIAGNOSIS — Z8673 Personal history of transient ischemic attack (TIA), and cerebral infarction without residual deficits: Secondary | ICD-10-CM

## 2015-11-20 DIAGNOSIS — I10 Essential (primary) hypertension: Secondary | ICD-10-CM | POA: Diagnosis present

## 2015-11-20 DIAGNOSIS — M545 Low back pain: Secondary | ICD-10-CM | POA: Diagnosis present

## 2015-11-20 DIAGNOSIS — IMO0002 Reserved for concepts with insufficient information to code with codable children: Secondary | ICD-10-CM | POA: Diagnosis present

## 2015-11-20 DIAGNOSIS — Z9114 Patient's other noncompliance with medication regimen: Secondary | ICD-10-CM

## 2015-11-20 DIAGNOSIS — I6522 Occlusion and stenosis of left carotid artery: Secondary | ICD-10-CM | POA: Diagnosis present

## 2015-11-20 HISTORY — DX: Lactose intolerance, unspecified: E73.9

## 2015-11-20 HISTORY — PX: PATCH ANGIOPLASTY: SHX6230

## 2015-11-20 HISTORY — DX: Benign prostatic hyperplasia without lower urinary tract symptoms: N40.0

## 2015-11-20 HISTORY — PX: ENDARTERECTOMY: SHX5162

## 2015-11-20 HISTORY — DX: Pneumonia, unspecified organism: J18.9

## 2015-11-20 HISTORY — DX: Calculus of kidney: N20.0

## 2015-11-20 LAB — CBC
HEMATOCRIT: 38.4 % — AB (ref 39.0–52.0)
HEMOGLOBIN: 12.2 g/dL — AB (ref 13.0–17.0)
MCH: 25.6 pg — ABNORMAL LOW (ref 26.0–34.0)
MCHC: 31.8 g/dL (ref 30.0–36.0)
MCV: 80.5 fL (ref 78.0–100.0)
Platelets: 309 10*3/uL (ref 150–400)
RBC: 4.77 MIL/uL (ref 4.22–5.81)
RDW: 14 % (ref 11.5–15.5)
WBC: 10.7 10*3/uL — AB (ref 4.0–10.5)

## 2015-11-20 LAB — PROTIME-INR
INR: 1.13 (ref 0.00–1.49)
PROTHROMBIN TIME: 14.7 s (ref 11.6–15.2)

## 2015-11-20 LAB — COMPREHENSIVE METABOLIC PANEL
ALK PHOS: 79 U/L (ref 38–126)
ALT: 12 U/L — AB (ref 17–63)
AST: 14 U/L — AB (ref 15–41)
Albumin: 3.4 g/dL — ABNORMAL LOW (ref 3.5–5.0)
Anion gap: 13 (ref 5–15)
BILIRUBIN TOTAL: 0.5 mg/dL (ref 0.3–1.2)
BUN: 26 mg/dL — AB (ref 6–20)
CO2: 22 mmol/L (ref 22–32)
CREATININE: 1.73 mg/dL — AB (ref 0.61–1.24)
Calcium: 9.3 mg/dL (ref 8.9–10.3)
Chloride: 102 mmol/L (ref 101–111)
GFR calc Af Amer: 47 mL/min — ABNORMAL LOW (ref >60)
GFR, EST NON AFRICAN AMERICAN: 40 mL/min — AB (ref >60)
GLUCOSE: 67 mg/dL (ref 65–99)
Potassium: 3.9 mmol/L (ref 3.5–5.1)
Sodium: 137 mmol/L (ref 135–145)
TOTAL PROTEIN: 7.5 g/dL (ref 6.5–8.1)

## 2015-11-20 LAB — TYPE AND SCREEN
ABO/RH(D): O POS
Antibody Screen: NEGATIVE

## 2015-11-20 LAB — URINALYSIS, ROUTINE W REFLEX MICROSCOPIC
BILIRUBIN URINE: NEGATIVE
Glucose, UA: NEGATIVE mg/dL
HGB URINE DIPSTICK: NEGATIVE
KETONES UR: NEGATIVE mg/dL
Leukocytes, UA: NEGATIVE
Nitrite: NEGATIVE
Protein, ur: 100 mg/dL — AB
SPECIFIC GRAVITY, URINE: 1.015 (ref 1.005–1.030)
pH: 5.5 (ref 5.0–8.0)

## 2015-11-20 LAB — URINE MICROSCOPIC-ADD ON

## 2015-11-20 LAB — GLUCOSE, CAPILLARY
GLUCOSE-CAPILLARY: 65 mg/dL (ref 65–99)
GLUCOSE-CAPILLARY: 118 mg/dL — AB (ref 65–99)
GLUCOSE-CAPILLARY: 126 mg/dL — AB (ref 65–99)
Glucose-Capillary: 192 mg/dL — ABNORMAL HIGH (ref 65–99)
Glucose-Capillary: 115 mg/dL — ABNORMAL HIGH (ref 65–99)
Glucose-Capillary: 123 mg/dL — ABNORMAL HIGH (ref 65–99)

## 2015-11-20 LAB — APTT: aPTT: 37 seconds (ref 24–37)

## 2015-11-20 SURGERY — ENDARTERECTOMY, CAROTID
Anesthesia: General | Site: Neck | Laterality: Left

## 2015-11-20 MED ORDER — HEPARIN SODIUM (PORCINE) 1000 UNIT/ML IJ SOLN
INTRAMUSCULAR | Status: AC
  Filled 2015-11-20: qty 1

## 2015-11-20 MED ORDER — ATORVASTATIN CALCIUM 40 MG PO TABS
40.0000 mg | ORAL_TABLET | Freq: Every day | ORAL | Status: DC
  Administered 2015-11-21 – 2015-11-22 (×2): 40 mg via ORAL
  Filled 2015-11-20 (×3): qty 1

## 2015-11-20 MED ORDER — DEXTROSE 5 % IV SOLN
1.5000 g | INTRAVENOUS | Status: AC
  Administered 2015-11-20: 1.5 g via INTRAVENOUS
  Filled 2015-11-20: qty 1.5

## 2015-11-20 MED ORDER — DORZOLAMIDE HCL-TIMOLOL MAL 2-0.5 % OP SOLN
1.0000 [drp] | Freq: Every day | OPHTHALMIC | Status: DC
  Administered 2015-11-21 – 2015-11-23 (×3): 1 [drp] via OPHTHALMIC
  Filled 2015-11-20: qty 10

## 2015-11-20 MED ORDER — PHENOL 1.4 % MT LIQD
1.0000 | OROMUCOSAL | Status: DC | PRN
Start: 2015-11-20 — End: 2015-11-23
  Filled 2015-11-20: qty 177

## 2015-11-20 MED ORDER — BISACODYL 10 MG RE SUPP: 10.0000 mg | Freq: Every day | RECTAL | Status: DC | PRN

## 2015-11-20 MED ORDER — INSULIN ASPART 100 UNIT/ML ~~LOC~~ SOLN
0.0000 [IU] | Freq: Three times a day (TID) | SUBCUTANEOUS | Status: DC
  Administered 2015-11-20: 2 [IU] via SUBCUTANEOUS
  Administered 2015-11-21 – 2015-11-22 (×4): 3 [IU] via SUBCUTANEOUS
  Administered 2015-11-22: 2 [IU] via SUBCUTANEOUS
  Administered 2015-11-22: 5 [IU] via SUBCUTANEOUS
  Administered 2015-11-23 (×2): 3 [IU] via SUBCUTANEOUS

## 2015-11-20 MED ORDER — HEMOSTATIC AGENTS (NO CHARGE) OPTIME
TOPICAL | Status: DC | PRN
  Administered 2015-11-20: 1 via TOPICAL

## 2015-11-20 MED ORDER — CLOPIDOGREL BISULFATE 75 MG PO TABS
75.0000 mg | ORAL_TABLET | Freq: Every day | ORAL | Status: DC
  Administered 2015-11-21 – 2015-11-23 (×3): 75 mg via ORAL
  Filled 2015-11-20 (×3): qty 1

## 2015-11-20 MED ORDER — LABETALOL HCL 5 MG/ML IV SOLN
5.0000 mg | INTRAVENOUS | Status: DC | PRN
  Administered 2015-11-20: 10 mg via INTRAVENOUS

## 2015-11-20 MED ORDER — HYDRALAZINE HCL 20 MG/ML IJ SOLN
5.0000 mg | INTRAMUSCULAR | Status: AC | PRN
  Administered 2015-11-20 (×2): 5 mg via INTRAVENOUS
  Filled 2015-11-20 (×2): qty 1

## 2015-11-20 MED ORDER — SODIUM CHLORIDE 0.9 % IV SOLN
2000.0000 ug | INTRAVENOUS | Status: DC | PRN
  Administered 2015-11-20: 0.15 ug/kg/min via INTRAVENOUS

## 2015-11-20 MED ORDER — OXYCODONE HCL 5 MG/5ML PO SOLN: 5.0000 mg | Freq: Once | ORAL | Status: DC | PRN

## 2015-11-20 MED ORDER — HEPARIN SODIUM (PORCINE) 1000 UNIT/ML IJ SOLN
INTRAMUSCULAR | Status: DC | PRN
  Administered 2015-11-20: 10000 [IU] via INTRAVENOUS

## 2015-11-20 MED ORDER — LIDOCAINE HCL (PF) 1 % IJ SOLN
INTRAMUSCULAR | Status: DC | PRN
  Administered 2015-11-20: 30 mL

## 2015-11-20 MED ORDER — TRAMADOL HCL 50 MG PO TABS
50.0000 mg | ORAL_TABLET | Freq: Four times a day (QID) | ORAL | Status: DC
  Administered 2015-11-20 – 2015-11-21 (×2): 50 mg via ORAL
  Filled 2015-11-20 (×3): qty 1

## 2015-11-20 MED ORDER — ACETAMINOPHEN 325 MG PO TABS: 325.0000 mg | ORAL_TABLET | ORAL | Status: DC | PRN

## 2015-11-20 MED ORDER — LIDOCAINE 2% (20 MG/ML) 5 ML SYRINGE
INTRAMUSCULAR | Status: AC
  Filled 2015-11-20: qty 5

## 2015-11-20 MED ORDER — SODIUM CHLORIDE 0.9 % IV SOLN
INTRAVENOUS | Status: DC
  Administered 2015-11-20 – 2015-11-21 (×2): via INTRAVENOUS

## 2015-11-20 MED ORDER — DEXTROSE 50 % IV SOLN
INTRAVENOUS | Status: AC
  Administered 2015-11-20: 1 via INTRAVENOUS
  Filled 2015-11-20: qty 50

## 2015-11-20 MED ORDER — LIDOCAINE HCL (PF) 1 % IJ SOLN
INTRAMUSCULAR | Status: AC
  Filled 2015-11-20: qty 30

## 2015-11-20 MED ORDER — DOCUSATE SODIUM 100 MG PO CAPS
100.0000 mg | ORAL_CAPSULE | Freq: Every day | ORAL | Status: DC
  Administered 2015-11-21 – 2015-11-23 (×3): 100 mg via ORAL
  Filled 2015-11-20 (×3): qty 1

## 2015-11-20 MED ORDER — PROPOFOL 10 MG/ML IV BOLUS
INTRAVENOUS | Status: AC
  Filled 2015-11-20: qty 20

## 2015-11-20 MED ORDER — LABETALOL HCL 5 MG/ML IV SOLN
10.0000 mg | INTRAVENOUS | Status: AC | PRN
  Administered 2015-11-20 (×4): 10 mg via INTRAVENOUS
  Filled 2015-11-20 (×2): qty 4

## 2015-11-20 MED ORDER — LACTATED RINGERS IV SOLN
INTRAVENOUS | Status: DC
  Administered 2015-11-20 (×2): via INTRAVENOUS

## 2015-11-20 MED ORDER — ROCURONIUM BROMIDE 50 MG/5ML IV SOLN
INTRAVENOUS | Status: AC
  Filled 2015-11-20: qty 1

## 2015-11-20 MED ORDER — LIDOCAINE HCL (CARDIAC) 20 MG/ML IV SOLN
INTRAVENOUS | Status: DC | PRN
  Administered 2015-11-20: 25 mg via INTRAVENOUS

## 2015-11-20 MED ORDER — LABETALOL HCL 5 MG/ML IV SOLN
INTRAVENOUS | Status: DC | PRN
  Administered 2015-11-20 (×2): 5 mg via INTRAVENOUS

## 2015-11-20 MED ORDER — ONDANSETRON HCL 4 MG/2ML IJ SOLN: 4.0000 mg | Freq: Once | INTRAMUSCULAR | Status: DC | PRN

## 2015-11-20 MED ORDER — OXYCODONE HCL 5 MG PO TABS: 5.0000 mg | ORAL_TABLET | Freq: Once | ORAL | Status: DC | PRN

## 2015-11-20 MED ORDER — GUAIFENESIN-DM 100-10 MG/5ML PO SYRP: 15.0000 mL | ORAL_SOLUTION | ORAL | Status: DC | PRN

## 2015-11-20 MED ORDER — PROPOFOL 10 MG/ML IV BOLUS
INTRAVENOUS | Status: DC | PRN
Start: 2015-11-20 — End: 2015-11-20
  Administered 2015-11-20: 150 mg via INTRAVENOUS

## 2015-11-20 MED ORDER — PANTOPRAZOLE SODIUM 40 MG PO TBEC
40.0000 mg | DELAYED_RELEASE_TABLET | Freq: Every day | ORAL | Status: DC
  Administered 2015-11-21 – 2015-11-23 (×3): 40 mg via ORAL
  Filled 2015-11-20 (×3): qty 1

## 2015-11-20 MED ORDER — HYDRALAZINE HCL 10 MG PO TABS
10.0000 mg | ORAL_TABLET | Freq: Three times a day (TID) | ORAL | Status: DC
  Administered 2015-11-20 – 2015-11-22 (×6): 10 mg via ORAL
  Filled 2015-11-20 (×6): qty 1

## 2015-11-20 MED ORDER — ACETAMINOPHEN 325 MG RE SUPP: 325.0000 mg | RECTAL | Status: DC | PRN

## 2015-11-20 MED ORDER — SODIUM CHLORIDE 0.9 % IV SOLN
0.1500 ug/kg/min | INTRAVENOUS | Status: DC
  Filled 2015-11-20: qty 2000

## 2015-11-20 MED ORDER — HYDRALAZINE HCL 20 MG/ML IJ SOLN
INTRAMUSCULAR | Status: AC
  Filled 2015-11-20: qty 1

## 2015-11-20 MED ORDER — HYDRALAZINE HCL 20 MG/ML IJ SOLN
5.0000 mg | Freq: Once | INTRAMUSCULAR | Status: AC
  Administered 2015-11-20: 10 mg via INTRAVENOUS

## 2015-11-20 MED ORDER — LACTATED RINGERS IV SOLN: INTRAVENOUS | Status: DC | PRN

## 2015-11-20 MED ORDER — ALUM & MAG HYDROXIDE-SIMETH 200-200-20 MG/5ML PO SUSP: 15.0000 mL | ORAL | Status: DC | PRN

## 2015-11-20 MED ORDER — ASPIRIN 81 MG PO CHEW
81.0000 mg | CHEWABLE_TABLET | Freq: Every day | ORAL | Status: DC
  Administered 2015-11-21 – 2015-11-23 (×3): 81 mg via ORAL
  Filled 2015-11-20 (×4): qty 1

## 2015-11-20 MED ORDER — DEXTROSE 5 % IV SOLN
1.5000 g | Freq: Two times a day (BID) | INTRAVENOUS | Status: AC
  Administered 2015-11-20 – 2015-11-21 (×2): 1.5 g via INTRAVENOUS
  Filled 2015-11-20 (×3): qty 1.5

## 2015-11-20 MED ORDER — POTASSIUM CHLORIDE CRYS ER 20 MEQ PO TBCR: 20.0000 meq | EXTENDED_RELEASE_TABLET | Freq: Every day | ORAL | Status: DC | PRN

## 2015-11-20 MED ORDER — NICOTINE 14 MG/24HR TD PT24
14.0000 mg | MEDICATED_PATCH | Freq: Every day | TRANSDERMAL | Status: DC
  Administered 2015-11-20 – 2015-11-23 (×4): 14 mg via TRANSDERMAL
  Filled 2015-11-20 (×4): qty 1

## 2015-11-20 MED ORDER — 0.9 % SODIUM CHLORIDE (POUR BTL) OPTIME
TOPICAL | Status: DC | PRN
  Administered 2015-11-20: 2000 mL

## 2015-11-20 MED ORDER — MORPHINE SULFATE (PF) 2 MG/ML IV SOLN
2.0000 mg | INTRAVENOUS | Status: DC | PRN
  Administered 2015-11-20 (×2): 3 mg via INTRAVENOUS
  Administered 2015-11-20 – 2015-11-21 (×3): 2 mg via INTRAVENOUS
  Filled 2015-11-20: qty 2
  Filled 2015-11-20: qty 1
  Filled 2015-11-20: qty 2
  Filled 2015-11-20 (×2): qty 1

## 2015-11-20 MED ORDER — SODIUM CHLORIDE 0.9 % IV SOLN
INTRAVENOUS | Status: DC | PRN
  Administered 2015-11-20: 08:00:00

## 2015-11-20 MED ORDER — NITROGLYCERIN IN D5W 200-5 MCG/ML-% IV SOLN
0.0000 ug/min | INTRAVENOUS | Status: DC
  Administered 2015-11-20: 5 ug/min via INTRAVENOUS
  Administered 2015-11-21: 75 ug/min via INTRAVENOUS
  Administered 2015-11-21: 80 ug/min via INTRAVENOUS
  Filled 2015-11-20 (×3): qty 250

## 2015-11-20 MED ORDER — METOPROLOL TARTRATE 5 MG/5ML IV SOLN
2.0000 mg | INTRAVENOUS | Status: AC | PRN
  Administered 2015-11-21 – 2015-11-22 (×2): 5 mg via INTRAVENOUS
  Filled 2015-11-20 (×2): qty 5

## 2015-11-20 MED ORDER — LIDOCAINE-EPINEPHRINE (PF) 1 %-1:200000 IJ SOLN
INTRAMUSCULAR | Status: AC
  Filled 2015-11-20: qty 30

## 2015-11-20 MED ORDER — FENTANYL CITRATE (PF) 100 MCG/2ML IJ SOLN
INTRAMUSCULAR | Status: DC | PRN
  Administered 2015-11-20 (×2): 50 ug via INTRAVENOUS
  Administered 2015-11-20: 150 ug via INTRAVENOUS

## 2015-11-20 MED ORDER — LABETALOL HCL 5 MG/ML IV SOLN
INTRAVENOUS | Status: AC
Start: 2015-11-20 — End: 2015-11-20
  Filled 2015-11-20: qty 4

## 2015-11-20 MED ORDER — DEXTRAN 40 IN SALINE 10-0.9 % IV SOLN
INTRAVENOUS | Status: AC
  Filled 2015-11-20: qty 500

## 2015-11-20 MED ORDER — SENNOSIDES-DOCUSATE SODIUM 8.6-50 MG PO TABS: 1.0000 | ORAL_TABLET | Freq: Every evening | ORAL | Status: DC | PRN

## 2015-11-20 MED ORDER — FENTANYL CITRATE (PF) 250 MCG/5ML IJ SOLN
INTRAMUSCULAR | Status: AC
  Filled 2015-11-20: qty 5

## 2015-11-20 MED ORDER — ONDANSETRON HCL 4 MG/2ML IJ SOLN: 4.0000 mg | Freq: Four times a day (QID) | INTRAMUSCULAR | Status: DC | PRN

## 2015-11-20 MED ORDER — SODIUM CHLORIDE 0.9 % IJ SOLN
INTRAMUSCULAR | Status: AC
  Filled 2015-11-20: qty 20

## 2015-11-20 MED ORDER — LATANOPROST 0.005 % OP SOLN
1.0000 [drp] | Freq: Every day | OPHTHALMIC | Status: DC
  Administered 2015-11-20 – 2015-11-22 (×3): 1 [drp] via OPHTHALMIC
  Filled 2015-11-20: qty 2.5

## 2015-11-20 MED ORDER — SUGAMMADEX SODIUM 500 MG/5ML IV SOLN
INTRAVENOUS | Status: DC | PRN
  Administered 2015-11-20: 200 mg via INTRAVENOUS

## 2015-11-20 MED ORDER — FENTANYL CITRATE (PF) 100 MCG/2ML IJ SOLN
25.0000 ug | INTRAMUSCULAR | Status: DC | PRN
  Administered 2015-11-20: 25 ug via INTRAVENOUS
  Administered 2015-11-20: 50 ug via INTRAVENOUS
  Administered 2015-11-20: 25 ug via INTRAVENOUS

## 2015-11-20 MED ORDER — SODIUM CHLORIDE 0.9 % IV SOLN: 500.0000 mL | Freq: Once | INTRAVENOUS | Status: DC | PRN

## 2015-11-20 MED ORDER — REMIFENTANIL HCL 1 MG IV SOLR
0.0125 ug/kg/min | INTRAVENOUS | Status: DC
  Filled 2015-11-20: qty 1000

## 2015-11-20 MED ORDER — PROTAMINE SULFATE 10 MG/ML IV SOLN
INTRAVENOUS | Status: DC | PRN
  Administered 2015-11-20: 10 mg via INTRAVENOUS
  Administered 2015-11-20: 40 mg via INTRAVENOUS

## 2015-11-20 MED ORDER — ROCURONIUM BROMIDE 100 MG/10ML IV SOLN
INTRAVENOUS | Status: DC | PRN
  Administered 2015-11-20: 50 mg via INTRAVENOUS

## 2015-11-20 MED ORDER — SODIUM CHLORIDE 0.9 % IV SOLN
10.0000 mg | INTRAVENOUS | Status: DC | PRN
  Administered 2015-11-20: 25 ug/min via INTRAVENOUS

## 2015-11-20 MED ORDER — LIDOCAINE-EPINEPHRINE (PF) 1 %-1:200000 IJ SOLN
INTRAMUSCULAR | Status: DC | PRN
  Administered 2015-11-20: 30 mL

## 2015-11-20 MED ORDER — FENTANYL CITRATE (PF) 100 MCG/2ML IJ SOLN
INTRAMUSCULAR | Status: AC
  Filled 2015-11-20: qty 2

## 2015-11-20 MED ORDER — DEXTRAN 40 IN SALINE 10-0.9 % IV SOLN
INTRAVENOUS | Status: DC | PRN
  Administered 2015-11-20: 500 mL

## 2015-11-20 SURGICAL SUPPLY — 46 items
BAG DECANTER FOR FLEXI CONT (MISCELLANEOUS) ×2 IMPLANT
CANISTER SUCTION 2500CC (MISCELLANEOUS) ×2 IMPLANT
CANNULA VESSEL 3MM 2 BLNT TIP (CANNULA) ×4 IMPLANT
CATH ROBINSON RED A/P 18FR (CATHETERS) ×2 IMPLANT
CLIP TI MEDIUM 24 (CLIP) ×2 IMPLANT
CLIP TI WIDE RED SMALL 24 (CLIP) ×2 IMPLANT
CRADLE DONUT ADULT HEAD (MISCELLANEOUS) ×2 IMPLANT
DRAIN CHANNEL 15F RND FF W/TCR (WOUND CARE) IMPLANT
ELECT REM PT RETURN 9FT ADLT (ELECTROSURGICAL) ×2
ELECTRODE REM PT RTRN 9FT ADLT (ELECTROSURGICAL) ×1 IMPLANT
EVACUATOR SILICONE 100CC (DRAIN) IMPLANT
GLOVE BIO SURGEON STRL SZ 6.5 (GLOVE) ×4 IMPLANT
GLOVE BIO SURGEON STRL SZ7.5 (GLOVE) ×2 IMPLANT
GLOVE BIOGEL PI IND STRL 7.0 (GLOVE) ×3 IMPLANT
GLOVE BIOGEL PI IND STRL 8 (GLOVE) ×1 IMPLANT
GLOVE BIOGEL PI INDICATOR 7.0 (GLOVE) ×3
GLOVE BIOGEL PI INDICATOR 8 (GLOVE) ×1
GLOVE SURG SS PI 6.5 STRL IVOR (GLOVE) ×2 IMPLANT
GLOVE SURG SS PI 7.0 STRL IVOR (GLOVE) ×2 IMPLANT
GOWN STRL REUS W/ TWL LRG LVL3 (GOWN DISPOSABLE) ×3 IMPLANT
GOWN STRL REUS W/ TWL XL LVL3 (GOWN DISPOSABLE) ×1 IMPLANT
GOWN STRL REUS W/TWL LRG LVL3 (GOWN DISPOSABLE) ×3
GOWN STRL REUS W/TWL XL LVL3 (GOWN DISPOSABLE) ×1
HEMOSTAT SPONGE AVITENE ULTRA (HEMOSTASIS) ×2 IMPLANT
KIT BASIN OR (CUSTOM PROCEDURE TRAY) ×2 IMPLANT
KIT ROOM TURNOVER OR (KITS) ×2 IMPLANT
KIT SHUNT ARGYLE CAROTID ART 6 (VASCULAR PRODUCTS) ×2 IMPLANT
LIQUID BAND (GAUZE/BANDAGES/DRESSINGS) ×2 IMPLANT
NEEDLE 18GX1X1/2 (RX/OR ONLY) (NEEDLE) ×2 IMPLANT
NEEDLE HYPO 25X1 1.5 SAFETY (NEEDLE) ×4 IMPLANT
NS IRRIG 1000ML POUR BTL (IV SOLUTION) ×6 IMPLANT
PACK CAROTID (CUSTOM PROCEDURE TRAY) ×2 IMPLANT
PAD ARMBOARD 7.5X6 YLW CONV (MISCELLANEOUS) ×4 IMPLANT
PATCH VASC XENOSURE 1CMX6CM (Vascular Products) ×1 IMPLANT
PATCH VASC XENOSURE 1X6 (Vascular Products) ×1 IMPLANT
SHUNT CAROTID BYPASS 10 (VASCULAR PRODUCTS) ×2 IMPLANT
SPONGE INTESTINAL PEANUT (DISPOSABLE) ×2 IMPLANT
SPONGE SURGIFOAM ABS GEL 100 (HEMOSTASIS) IMPLANT
SUT PROLENE 6 0 BV (SUTURE) ×8 IMPLANT
SUT SILK 2 0 FS (SUTURE) IMPLANT
SUT VIC AB 3-0 SH 27 (SUTURE) ×1
SUT VIC AB 3-0 SH 27X BRD (SUTURE) ×1 IMPLANT
SUT VICRYL 4-0 PS2 18IN ABS (SUTURE) ×2 IMPLANT
SYR CONTROL 10ML LL (SYRINGE) ×4 IMPLANT
SYRINGE 20CC LL (MISCELLANEOUS) ×2 IMPLANT
WATER STERILE IRR 1000ML POUR (IV SOLUTION) ×2 IMPLANT

## 2015-11-20 NOTE — Anesthesia Preprocedure Evaluation (Addendum)
Anesthesia Evaluation  Patient identified by MRN, date of birth, ID band Patient awake    Reviewed: Allergy & Precautions, H&P , NPO status , Patient's Chart, lab work & pertinent test results  Airway Mallampati: II  TM Distance: >3 FB Neck ROM: full    Dental  (+) Teeth Intact, Dental Advidsory Given   Pulmonary Current Smoker,    breath sounds clear to auscultation       Cardiovascular hypertension, On Medications  Rhythm:regular     Neuro/Psych TIACVA, Residual Symptoms    GI/Hepatic   Endo/Other  diabetes, Poorly Controlled, Type 1, Insulin Dependent  Renal/GU      Musculoskeletal   Abdominal   Peds  Hematology   Anesthesia Other Findings   Reproductive/Obstetrics                            Anesthesia Physical Anesthesia Plan  ASA: III  Anesthesia Plan: General   Post-op Pain Management:    Induction: Intravenous  Airway Management Planned: Oral ETT  Additional Equipment: Arterial line  Intra-op Plan:   Post-operative Plan: Extubation in OR  Informed Consent: I have reviewed the patients History and Physical, chart, labs and discussed the procedure including the risks, benefits and alternatives for the proposed anesthesia with the patient or authorized representative who has indicated his/her understanding and acceptance.   Dental Advisory Given  Plan Discussed with: Anesthesiologist, CRNA and Surgeon  Anesthesia Plan Comments:        Anesthesia Quick Evaluation

## 2015-11-20 NOTE — Telephone Encounter (Signed)
-----   Message from Sharee PimpleMarilyn K McChesney, RN sent at 11/20/2015 10:07 AM EDT ----- Regarding: schedule   ----- Message -----    From: Dara LordsSamantha J Rhyne, PA-C    Sent: 11/20/2015   9:48 AM      To: Vvs Charge Pool  S/p left CEA 11/20/15.  F/u with C[facility] in 2 weeks.  Thanks, Lelon MastSamantha

## 2015-11-20 NOTE — Progress Notes (Signed)
   VASCULAR SURGERY POSTOP NOTE:   Pt is stable postop.   BP well controlled with hydralazine prn   Anticipate d/c in AM.   Is on ASA and a statin   SUBJECTIVE: Pain well controlled  PHYSICAL EXAM: Filed Vitals:   11/20/15 1315 11/20/15 1330 11/20/15 1426 11/20/15 1500  BP:   170/68 161/65  Pulse: 55 56 59 61  Temp:      TempSrc:      Resp: 16 18 16 18   Height:      Weight:      SpO2: 100% 100% 99% 99%   Neuro intact Incision looks fine  LABS: Lab Results  Component Value Date   WBC 10.7* 11/20/2015   HGB 12.2* 11/20/2015   HCT 38.4* 11/20/2015   MCV 80.5 11/20/2015   PLT 309 11/20/2015   Lab Results  Component Value Date   CREATININE 1.73* 11/20/2015   Lab Results  Component Value Date   INR 1.13 11/20/2015   CBG (last 3)   Recent Labs  11/20/15 0704 11/20/15 1006 11/20/15 1207  GLUCAP 192* 118* 115*    Active Problems:   Left carotid artery stenosis    Cari CarawayChris Dickson Beeper: [phone] 11/20/2015

## 2015-11-20 NOTE — Transfer of Care (Signed)
Immediate Anesthesia Transfer of Care Note  Patient: Lance PandyRobert Dani  Procedure(s) Performed: Procedure(s): ENDARTERECTOMY LEFT CAROTID (Left) PATCH ANGIOPLASTY LEFT CAROTID ARTERY USING XENOSURE BIOLOGIC PATCH (Left)  Patient Location: PACU  Anesthesia Type:General  Level of Consciousness: awake, alert  and oriented  Airway & Oxygen Therapy: Patient Spontanous Breathing and Patient connected to nasal cannula oxygen  Post-op Assessment: Report given to RN, Post -op Vital signs reviewed and stable, Patient moving all extremities X 4 and Patient able to stick tongue midline  Post vital signs: Reviewed and stable  Last Vitals:  Filed Vitals:   11/20/15 0618 11/20/15 1005  BP: 175/78   Pulse: 63 71  Temp: 36.7 C   Resp: 20 15    Last Pain:  Filed Vitals:   11/20/15 1006  PainSc: 3       Patients Stated Pain Goal: 2 (11/20/15 [id number])  Complications: No apparent anesthesia complications

## 2015-11-20 NOTE — Anesthesia Procedure Notes (Signed)
Procedure Name: Intubation Date/Time: 11/20/2015 7:41 AM Performed by: Carmela RimaMARTINELLI, Amie Cowens F Pre-anesthesia Checklist: Patient identified, Suction available, Emergency Drugs available and Patient being monitored Patient Re-evaluated:Patient Re-evaluated prior to inductionOxygen Delivery Method: Circle system utilized Preoxygenation: Pre-oxygenation with 100% oxygen Intubation Type: IV induction Ventilation: Oral airway inserted - appropriate to patient size and Mask ventilation without difficulty Laryngoscope Size: Mac and 3 Grade View: Grade III Tube type: Oral Tube size: 7.5 mm Number of attempts: 1 Airway Equipment and Method: Stylet Placement Confirmation: positive ETCO2 and breath sounds checked- equal and bilateral Secured at: 21 cm Tube secured with: Tape Dental Injury: Teeth and Oropharynx as per pre-operative assessment

## 2015-11-20 NOTE — Anesthesia Postprocedure Evaluation (Signed)
Anesthesia Post Note  Patient: Lance PandyRobert Mates  Procedure(s) Performed: Procedure(s) (LRB): ENDARTERECTOMY LEFT CAROTID (Left) PATCH ANGIOPLASTY LEFT CAROTID ARTERY USING XENOSURE BIOLOGIC PATCH (Left)  Patient location during evaluation: PACU Anesthesia Type: General Level of consciousness: awake and alert Pain management: pain level controlled Vital Signs Assessment: post-procedure vital signs reviewed and stable Respiratory status: spontaneous breathing, nonlabored ventilation, respiratory function stable and patient connected to nasal cannula oxygen Cardiovascular status: blood pressure returned to baseline and stable Postop Assessment: no signs of nausea or vomiting Anesthetic complications: no    Last Vitals:  Filed Vitals:   11/20/15 1230 11/20/15 1303  BP: 143/67 135/67  Pulse: 58 57  Temp:    Resp: 19 17    Last Pain:  Filed Vitals:   11/20/15 1304  PainSc: Asleep                 Shelton SilvasKevin D Vali Capano

## 2015-11-20 NOTE — H&P (View-Only) (Signed)
VASCULAR SURGERY:  ADDENDUM: I have reviewed the carotid duplex can results. There is a moderate 40-59% right carotid stenosis with a tight, clearly greater than 80% left carotid stenosis. I have scheduled him for a left carotid endarterectomy on 11/06/15. The left carotid stenosis is asymptomatic. Given that this stenosis is tight and also his recent right brain TIA, I will leave him on Plavix through surgery. The medical team is arranging cardiac clearance. From my standpoint he could be discharged and we can bring him back electively for his left carotid endarterectomy on 11/06/15. My partner Dr. Fabienne Brunsharles Fields will be covering for me while I'm away to meeting.  Lance Ferrarihristopher Alven Alverio, MD, FACS Beeper [phone] Office: [phone]

## 2015-11-20 NOTE — Progress Notes (Signed)
Pt states he took 37 units of Lantus last evening.  CBG this morning at home when woke up and it was in the low 50's. States he took a mint and rechecked it at 0500 before coming to hospital and it was 5778.  Current CBG back down to 65.  IV started.  Reported off to CRNA.

## 2015-11-20 NOTE — Telephone Encounter (Signed)
CSD off 5/31 and 6/7. Sched appt 6/16 at 8:30. Lm on hm# to inform pt.

## 2015-11-20 NOTE — Op Note (Signed)
NAME: Lance PandyRobert Duncan   MRN: [id number] DOB: 1951-05-12    DATE OF OPERATION: 11/20/2015  PREOP DIAGNOSIS: greater than 80% left carotid stenosis  POSTOP DIAGNOSIS: same  PROCEDURE: Left carotid endarterectomy with bovine pericardial patch angioplasty  SURGEON: Di Kindlehristopher S. Edilia Boickson, MD, FACS  ASSIST: Doreatha MassedSamantha Rhyne, PA  ANESTHESIA: Gen.   EBL: minimal  INDICATIONS: Lance PandyRobert Duncan is a 65 y.o. male wwho I had seen in consultation on 10/23/2015. The patient had left upper extremity and left lower extremity weakness. However cerebral arteriogram showed a smooth 50% right carotid stenosis with an 80% left carotid stenosis.  The CT angiogram of the neck suggested a string sign despite the findings on arteriogram. Duplex scan showed very high velocities suggesting a critical left carotid stenosis. Left carotid endarterectomy was  Recommended in order to lower his risk of future stroke. The etiology of his right brain symptoms was not clear as he had only a very smooth 50% right carotid stenosis.  FINDINGS: The bifurcation was low. The plaque extended fairly high up the internal carotid artery. The stenosis was greater than 90%.  TECHNIQUE: The patient was taken to the operating room and received a general anesthetic. The left neck was prepped and draped in usual sterile fashion. An incision was made along the anterior border of the sternocleidomastoid and the dissection carried down to the common carotid artery which was dissected free and controlled with Rummel tourniquet The facial vein was divided between  2-0 silk ties. The internal carotid artery was controlled above the plaque which extended fairly high. The external carotid and superior thyroid arteries were controlled. The patient had been heparinized with 10,000 units of IV heparin. Clamps were then placed on the internal and the common and the external carotid artery. A longitudinal arteriotomy was made in the common carotid artery  and this was extended through the plaque into the internal carotid artery. A 10 shunt was placed into the internal carotid artery, backblede common carotid artery and secured with Rummel tourniquet. Flow was reestablished to the shunt. An endarterectomy plane was established proximally and the plaque was sharply divided. Eversion endarterectomy was performed of the external carotid artery. Distally it was a nice taper and the plaque and no tacking sutures were required. The artery was irrigated with copious amounts of Dextran and heparin and all loose debris was removed. A bovine pericardial patch was tailored and sewn using continuous 6-0 Prolene suture.Prior to completing the patch closure,  The shunt was removed. The arteries were backbled and flushed appropriately and the anastomosis completed. Flow was reestablished first to the external carotid artery and then to the internal carotid artery. There was excellent Doppler signal in the internal carotid artery but poor flow in the external carotid artery. I therefore clamped this proximally and distally and a transverse arteriotomy made. This was endarterectomized with her was a dissection in and this was closed with running 6-0 Prolene suture. At the completion was good flow in the external carotid artery.  The heparin was partially reversed with protamine. The wound was closed with a deep layer of 3-0 Vicryl. The platysma was closed with running 3-0 Vicryl. The skin was closed with a 4-0 subcuticular stitch. Liquid band was applied. The patient tolerated the procedure well and was transferred to the recovery room in stable condition. He awoke neurologically intact. All needle and sponge counts were correct.  Waverly Ferrarihristopher Lallie Strahm, MD, FACS [facility]  DATE OF DICTATION:   11/20/2015

## 2015-11-20 NOTE — Interval H&P Note (Signed)
History and Physical Interval Note:  11/20/2015 7:18 AM  Lance Duncan  has presented today for surgery, with the diagnosis of Left carotid artery stenosis I65.22  The various methods of treatment have been discussed with the patient and family. After consideration of risks, benefits and other options for treatment, the patient has consented to  Procedure(s): ENDARTERECTOMY CAROTID (Left) as a surgical intervention .  The patient's history has been reviewed, patient examined, no change in status, stable for surgery.  I have reviewed the patient's chart and labs.  Questions were answered to the patient's satisfaction.     Waverly Ferrariickson, Ehsan Corvin

## 2015-11-21 ENCOUNTER — Encounter (HOSPITAL_COMMUNITY): Payer: Self-pay | Admitting: Vascular Surgery

## 2015-11-21 LAB — CBC
HEMATOCRIT: 34.7 % — AB (ref 39.0–52.0)
HEMOGLOBIN: 11 g/dL — AB (ref 13.0–17.0)
MCH: 25.3 pg — ABNORMAL LOW (ref 26.0–34.0)
MCHC: 31.7 g/dL (ref 30.0–36.0)
MCV: 80 fL (ref 78.0–100.0)
Platelets: 281 10*3/uL (ref 150–400)
RBC: 4.34 MIL/uL (ref 4.22–5.81)
RDW: 14 % (ref 11.5–15.5)
WBC: 11.7 10*3/uL — ABNORMAL HIGH (ref 4.0–10.5)

## 2015-11-21 LAB — GLUCOSE, CAPILLARY
GLUCOSE-CAPILLARY: 143 mg/dL — AB (ref 65–99)
GLUCOSE-CAPILLARY: 172 mg/dL — AB (ref 65–99)
GLUCOSE-CAPILLARY: 175 mg/dL — AB (ref 65–99)
GLUCOSE-CAPILLARY: 136 mg/dL — AB (ref 65–99)
Glucose-Capillary: 164 mg/dL — ABNORMAL HIGH (ref 65–99)

## 2015-11-21 LAB — BASIC METABOLIC PANEL
ANION GAP: 10 (ref 5–15)
BUN: 20 mg/dL (ref 6–20)
CHLORIDE: 102 mmol/L (ref 101–111)
CO2: 22 mmol/L (ref 22–32)
Calcium: 8.7 mg/dL — ABNORMAL LOW (ref 8.9–10.3)
Creatinine, Ser: 1.45 mg/dL — ABNORMAL HIGH (ref 0.61–1.24)
GFR calc non Af Amer: 50 mL/min — ABNORMAL LOW (ref >60)
GFR, EST AFRICAN AMERICAN: 58 mL/min — AB (ref >60)
Glucose, Bld: 143 mg/dL — ABNORMAL HIGH (ref 65–99)
POTASSIUM: 4.4 mmol/L (ref 3.5–5.1)
SODIUM: 134 mmol/L — AB (ref 135–145)

## 2015-11-21 MED ORDER — HYDROCHLOROTHIAZIDE 25 MG PO TABS
25.0000 mg | ORAL_TABLET | Freq: Every day | ORAL | Status: DC
  Administered 2015-11-21 – 2015-11-23 (×3): 25 mg via ORAL
  Filled 2015-11-21 (×4): qty 1

## 2015-11-21 MED ORDER — LISINOPRIL 40 MG PO TABS
40.0000 mg | ORAL_TABLET | Freq: Every day | ORAL | Status: DC
  Administered 2015-11-21 – 2015-11-23 (×3): 40 mg via ORAL
  Filled 2015-11-21 (×3): qty 1

## 2015-11-21 MED ORDER — OXYCODONE-ACETAMINOPHEN 5-325 MG PO TABS
1.0000 | ORAL_TABLET | ORAL | Status: DC | PRN
  Administered 2015-11-21 – 2015-11-23 (×6): 1 via ORAL
  Filled 2015-11-21 (×7): qty 1

## 2015-11-21 NOTE — Progress Notes (Signed)
Unable to wean pt off nitro gtt due to BP increasing when weaned. Sam PA called. Per PA and MD, goal to keep SBP below 180. No other orders given. Will cont to monitor BP.

## 2015-11-21 NOTE — Care Management Note (Signed)
Case Management Note  Patient Details  Name: Lance PandyRobert Duncan MRN: [id number] Date of Birth: [birth year]  Subjective/Objective:   Patient lives alone, but at dc will be going to stay with wife for a while.  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  Patient states he may go home tomorrow per MD and that he does not think he needs any HH services.  NCM will cont to follow for dc needs.                  Action/Plan:   Expected Discharge Date:                  Expected Discharge Plan:  Home/Self Care  In-House Referral:     Discharge planning Services  CM Consult  Post Acute Care Choice:    Choice offered to:     DME Arranged:    DME Agency:     HH Arranged:    HH Agency:     Status of Service:  In process, will continue to follow  Medicare Important Message Given:    Date Medicare IM Given:    Medicare IM give by:    Date Additional Medicare IM Given:    Additional Medicare Important Message give by:     If discussed at Long Length of Stay Meetings, dates discussed:    Additional Comments:  Leone Havenaylor, Shawan Corella Clinton, RN 11/21/2015, 4:34 PM

## 2015-11-21 NOTE — Anesthesia Postprocedure Evaluation (Signed)
Anesthesia Post Note  Patient: Reed PandyRobert Mayall  Procedure(s) Performed: Procedure(s) (LRB): ENDARTERECTOMY LEFT CAROTID (Left) PATCH ANGIOPLASTY LEFT CAROTID ARTERY USING XENOSURE BIOLOGIC PATCH (Left)  Patient location during evaluation: PACU Anesthesia Type: General Level of consciousness: awake, awake and alert and oriented Pain management: pain level controlled Vital Signs Assessment: post-procedure vital signs reviewed and stable Respiratory status: spontaneous breathing, nonlabored ventilation and respiratory function stable Cardiovascular status: blood pressure returned to baseline Anesthetic complications: no    Last Vitals:  Filed Vitals:   11/21/15 0709 11/21/15 0800  BP: 166/67 181/65  Pulse: 73 75  Temp: 36.9 C   Resp: 17 21    Last Pain:  Filed Vitals:   11/21/15 0910  PainSc: 2                  Chisom Aust COKER

## 2015-11-21 NOTE — Progress Notes (Addendum)
  Progress Note  VASCULAR SURGERY ASSESSMENT AND PLAN:   Doing well status post left carotid endarterectomy.   He can be discharged today if he can be weaned off of his nitroglycerin.  VASCULAR QUALITY INITIATIVE (VQI): The patient is on aspirin and is on a statin. They will have a follow up visit one year postop.  Cari Carawayhris Dickson, MD [phone]   11/21/2015 8:01 AM 1 Day Post-Op  Subjective:  Wants to know when the soreness is gonna go away and the swelling; denies shortness of breath or trouble swallowing-only a sore throat  Tm 99.1 HR 60's-70's NSR 150's-180's systolic 97% RA  Gtts:  NTG started last pm  Filed Vitals:   11/21/15 0630 11/21/15 0709  BP: 159/79 166/67  Pulse: 65 73  Temp:    Resp: 24 17     Physical Exam: Neuro:  In tact Lungs:  Non labored Incision:  Clean and dry with mild fullness  CBC    Component Value Date/Time   WBC 11.7* 11/21/2015 0455   RBC 4.34 11/21/2015 0455   HGB 11.0* 11/21/2015 0455   HCT 34.7* 11/21/2015 0455   PLT 281 11/21/2015 0455   MCV 80.0 11/21/2015 0455   MCH 25.3* 11/21/2015 0455   MCHC 31.7 11/21/2015 0455   RDW 14.0 11/21/2015 0455   LYMPHSABS 2.6 10/22/2015 0911   MONOABS 1.4* 10/22/2015 0911   EOSABS 0.0 10/22/2015 0911   BASOSABS 0.0 10/22/2015 0911    BMET    Component Value Date/Time   NA 134* 11/21/2015 0455   K 4.4 11/21/2015 0455   CL 102 11/21/2015 0455   CO2 22 11/21/2015 0455   GLUCOSE 143* 11/21/2015 0455   BUN 20 11/21/2015 0455   CREATININE 1.45* 11/21/2015 0455   CALCIUM 8.7* 11/21/2015 0455   GFRNONAA 50* 11/21/2015 0455   GFRAA 58* 11/21/2015 0455     Intake/Output Summary (Last 24 hours) at 11/21/15 0801 Last data filed at 11/21/15 0716  Gross per 24 hour  Intake 2226.34 ml  Output   1250 ml  Net 976.34 ml     Assessment/Plan:  This is a 65 y.o. male who is s/p left CEA 1 Day Post-Op  -pt is doing well this am. -pt neuro exam is in tact, however, he is requiring NTG  gtt for blood pressure control.  His lisinopril and HTCZ were not ordered yesterday due to his elevated creatinine, which is better this morning at 1.43 (down from 1.73) -since creatinine is improved, may possibly restart lisinopril--will d/w Dr. Edilia Boickson. -wean NTG as tolerated -keep in stepdown today  Doreatha MassedSamantha Rhyne, PA-C [facility] [phone]  Addendum:  Will restart Lisinopril and HCTZ per Dr. Edilia Boickson and have him f/u with his PCP to evaluate BP and labs.  Doreatha MassedRHYNE, SAMANTHA 11/21/2015 8:43 AM   I have interviewed the patient and examined the patient. I agree with the findings by the PA.

## 2015-11-22 DIAGNOSIS — I6522 Occlusion and stenosis of left carotid artery: Secondary | ICD-10-CM

## 2015-11-22 LAB — GLUCOSE, CAPILLARY
GLUCOSE-CAPILLARY: 147 mg/dL — AB (ref 65–99)
GLUCOSE-CAPILLARY: 169 mg/dL — AB (ref 65–99)
Glucose-Capillary: 205 mg/dL — ABNORMAL HIGH (ref 65–99)
Glucose-Capillary: 150 mg/dL — ABNORMAL HIGH (ref 65–99)

## 2015-11-22 LAB — BASIC METABOLIC PANEL
Anion gap: 10 (ref 5–15)
BUN: 22 mg/dL — AB (ref 6–20)
CALCIUM: 8.8 mg/dL — AB (ref 8.9–10.3)
CO2: 23 mmol/L (ref 22–32)
CREATININE: 1.5 mg/dL — AB (ref 0.61–1.24)
Chloride: 100 mmol/L — ABNORMAL LOW (ref 101–111)
GFR calc Af Amer: 55 mL/min — ABNORMAL LOW (ref >60)
GFR, EST NON AFRICAN AMERICAN: 48 mL/min — AB (ref >60)
GLUCOSE: 145 mg/dL — AB (ref 65–99)
Potassium: 4.5 mmol/L (ref 3.5–5.1)
Sodium: 133 mmol/L — ABNORMAL LOW (ref 135–145)

## 2015-11-22 MED ORDER — HYDRALAZINE HCL 10 MG PO TABS
10.0000 mg | ORAL_TABLET | Freq: Four times a day (QID) | ORAL | Status: DC
  Administered 2015-11-22: 10 mg via ORAL
  Filled 2015-11-22: qty 1

## 2015-11-22 MED ORDER — HYDRALAZINE HCL 20 MG/ML IJ SOLN
INTRAMUSCULAR | Status: AC
  Filled 2015-11-22: qty 1

## 2015-11-22 MED ORDER — HYDRALAZINE HCL 20 MG/ML IJ SOLN
10.0000 mg | INTRAMUSCULAR | Status: DC | PRN
  Administered 2015-11-23: 10 mg via INTRAVENOUS
  Filled 2015-11-22: qty 1

## 2015-11-22 MED ORDER — HYDRALAZINE HCL 50 MG PO TABS
50.0000 mg | ORAL_TABLET | Freq: Four times a day (QID) | ORAL | Status: DC
  Administered 2015-11-22 – 2015-11-23 (×4): 50 mg via ORAL
  Filled 2015-11-22 (×4): qty 1

## 2015-11-22 MED ORDER — HYDRALAZINE HCL 20 MG/ML IJ SOLN
5.0000 mg | INTRAMUSCULAR | Status: AC | PRN
  Administered 2015-11-22 (×2): 5 mg via INTRAVENOUS
  Filled 2015-11-22: qty 1

## 2015-11-22 MED ORDER — HYDRALAZINE HCL 20 MG/ML IJ SOLN: 5.0000 mg | INTRAMUSCULAR | Status: DC | PRN

## 2015-11-22 MED ORDER — TRAMADOL HCL 50 MG PO TABS: 50.0000 mg | ORAL_TABLET | Freq: Four times a day (QID) | ORAL | Status: AC

## 2015-11-22 NOTE — Progress Notes (Signed)
  [facility]  Patient off nitroglycerin gtt this am, but SBP has remained at 200. Po hydralazine changed to QID. After most recent dose, SBP was 220s. IV hydralazine given. Baseline BP in 180s. Will ask medicine service for assistance with blood pressure. Patient neurologically intact. Hopefully d/c home tomorrow once SBP <180 and follow-up with PCP Monday.     Maris BergerKimberly Lyzette Reinhardt, PA-C [facility] Office: [phone] Pager: [phone] 11/22/2015 3:20 PM

## 2015-11-22 NOTE — Progress Notes (Signed)
Paged Karsten RoKim Trinh PA regarding pt's BP 200/80, came to see pt. New orders rec'd to give hydralazine po

## 2015-11-22 NOTE — Consult Note (Signed)
Patient Demographics  Lance Duncan, is a 65 y.o. male   MRN: [id number]   DOB - 10/07/1950  Admit Date - 11/20/2015    Outpatient Primary MD for the patient is [facility]  Consult requested in the Hospital by Chuck Hint, MD, On 11/22/2015    Reason for consult : uncontrolled blood pressure   With History of -  Past Medical History  Diagnosis Date  . Hypertension   . Diverticula of small intestine   . Type II diabetes mellitus (HCC)   . Hepatitis C     "I'm cured now; tx ended 01/2015" (10/22/2015)  . Stroke (HCC) 10/22/2015    "left side is weaker now" (10/22/2015)  . Arthritis     "mild; hips" (10/22/2015)  . Chronic lower back pain   . Pneumonia     as a teenager  . Kidney stones   . Prostate enlargement   . Lactose intolerance in adult       Past Surgical History  Procedure Laterality Date  . Penile prosthesis implant    . Testicle removal Right     "benign"  . Colonoscopy  1990s  . Tear duct probing Bilateral   . Variococele repair  ~ 1975    "tied up varicose veins in one of my groins"  . Eye surgery      10-12 years ago laser surgery for tear ducts  . Endarterectomy Left 11/20/2015    Procedure: ENDARTERECTOMY LEFT CAROTID;  Surgeon: Chuck Hint, MD;  Location: [facility] OR;  Service: Vascular;  Laterality: Left;  . Patch angioplasty Left 11/20/2015    Procedure: PATCH ANGIOPLASTY LEFT CAROTID ARTERY USING XENOSURE BIOLOGIC PATCH;  Surgeon: Chuck Hint, MD;  Location: [facility] OR;  Service: Vascular;  Laterality: Left;    in for   No chief complaint on file.    HPI  Lance Duncan  is a 65 y.o. male, With past medical history of hypertension, diabetes mellitus, diet noncompliance, was admitted in April 2017 for left-sided weakness, found to be secondary to TIA, workup was significant for left carotid stenosis, patient admitted on  11/20/2014 for left patchy endarterectomy, postoperatively hospital course was complicated for control blood pressure, initially required nitroglycerin GTT,, discontinued this a.m., blood pressure remains uncontrolled, with systolic readings more than 200s, hospitalist was called to admit, patient has been complaining of occasional headache, but denies any chest pain, denies any shortness of breath.   Review of Systems    In addition to the HPI above,  No Fever-chills, No Headache, No changes with Vision or hearing, No problems swallowing food or Liquids, No Chest pain, Cough or Shortness of Breath, No Abdominal pain, No Nausea or Vommitting, Bowel movements are regular, No Blood in stool or Urine, No dysuria, No new skin rashes or bruises, No new joints pains-aches,  No new weakness, tingling, numbness in any extremity, No recent weight gain or loss, No polyuria, polydypsia or polyphagia, No significant Mental Stressors.  A full 10 point  Review of Systems was done, except as stated above, all other Review of Systems were negative.   Social History Social History  Substance Use Topics  . Smoking status: Current Every Day Smoker -- 0.50 packs/day for 46 years    Types: Cigarettes  . Smokeless tobacco: Never Used  . Alcohol Use: Yes     Comment: 10/22/2015 "I have a drink q 3-4 weeks"    Family History Family History  Problem Relation Age of Onset  . Stroke Mother   . Stroke Father   . Stroke Sister   . Stroke Brother      Prior to Admission medications   Medication Sig Start Date End Date Taking? Authorizing Provider  aspirin 81 MG chewable tablet Chew 1 tablet (81 mg total) by mouth daily. 10/24/15  Yes Rushil Terrilee CroakPatel V, MD  atorvastatin (LIPITOR) 40 MG tablet Take 1 tablet (40 mg total) by mouth daily at 6 PM. Patient taking differently: Take 40 mg by mouth daily.  10/24/15  Yes Rushil Terrilee CroakPatel V, MD  clopidogrel (PLAVIX) 75 MG tablet Take 1 tablet (75 mg total) by mouth  daily. 10/24/15  Yes Rushil Terrilee CroakPatel V, MD  dorzolamide-timolol (COSOPT) 22.3-6.8 MG/ML ophthalmic solution Place 1 drop into both eyes daily.    Yes Historical Provider, MD  hydrALAZINE (APRESOLINE) 10 MG tablet Take 10 mg by mouth 3 (three) times daily.   Yes Historical Provider, MD  hydrochlorothiazide (HYDRODIURIL) 25 MG tablet Take 25 mg by mouth daily.   Yes Historical Provider, MD  insulin glargine (LANTUS) 100 unit/mL SOPN Inject 66-74 Units into the skin 2 (two) times daily. Inject 66 units subcutaneously every morning (before 1st meal) and 74 units at bedtime   Yes Historical Provider, MD  latanoprost (XALATAN) 0.005 % ophthalmic solution Place 1 drop into both eyes at bedtime.   Yes Historical Provider, MD  lisinopril (PRINIVIL,ZESTRIL) 40 MG tablet Take 40 mg by mouth daily.   Yes Historical Provider, MD  nicotine (NICODERM CQ - DOSED IN MG/24 HOURS) 14 mg/24hr patch Place 1 patch (14 mg total) onto the skin daily. 10/24/15  Yes Rushil Terrilee CroakPatel V, MD  nicotine polacrilex (NICORETTE) 2 MG gum Take 1 each (2 mg total) by mouth every 2 (two) hours as needed for smoking cessation. 10/24/15  Yes Rushil Terrilee CroakPatel V, MD  traMADol (ULTRAM) 50 MG tablet Take 1 tablet (50 mg total) by mouth 4 (four) times daily. scheduled 11/22/15   Raymond GurneyKimberly A Trinh, PA-C    Anti-infectives    Start     Dose/Rate Route Frequency Ordered Stop   11/20/15 2000  cefUROXime (ZINACEF) 1.5 g in dextrose 5 % 50 mL IVPB     1.5 g 100 mL/hr over 30 Minutes Intravenous Every 12 hours 11/20/15 1410 11/21/15 0948   11/20/15 0519  cefUROXime (ZINACEF) 1.5 g in dextrose 5 % 50 mL IVPB     1.5 g 100 mL/hr over 30 Minutes Intravenous 30 min pre-op 11/20/15 0519 11/20/15 0744      Scheduled Meds: . aspirin  81 mg Oral Daily  . atorvastatin  40 mg Oral q1800  . clopidogrel  75 mg Oral Daily  . docusate sodium  100 mg Oral Daily  . dorzolamide-timolol  1 drop Both Eyes Daily  . hydrALAZINE  50 mg Oral Q6H  . hydrochlorothiazide  25 mg  Oral Daily  . insulin aspart  0-15 Units Subcutaneous TID WC  . latanoprost  1 drop Both Eyes QHS  . lisinopril  40 mg  Oral Daily  . nicotine  14 mg Transdermal Daily  . pantoprazole  40 mg Oral Daily   Continuous Infusions: . sodium chloride Stopped (11/22/15 0935)  . nitroGLYCERIN Stopped (11/22/15 0935)   PRN Meds:.sodium chloride, acetaminophen **OR** acetaminophen, alum & mag hydroxide-simeth, bisacodyl, guaiFENesin-dextromethorphan, hydrALAZINE, hydrALAZINE, morphine injection, ondansetron, oxyCODONE-acetaminophen, phenol, potassium chloride, senna-docusate  No Known Allergies  Physical Exam  Vitals  Blood pressure 174/79, pulse 73, temperature 98.5 F (36.9 C), temperature source Oral, resp. rate 28, height 5' 10.5" (1.791 m), weight 105.688 kg (233 lb), SpO2 96 %.   1. General Well-developed male lying in bed in NAD.  2. Normal affect and insight, Not Suicidal or Homicidal, Awake Alert, Oriented X 3.  3. No F.N deficits, ALL C.Nerves Intact, Strength 5/5 all 4 extremities, Sensation intact all 4 extremities, Plantars down going.  4. Ears and Eyes appear Normal, Conjunctivae clear, PERRLA. Moist Oral Mucosa.  5. Supple Neck, No JVD, left Surgical scar, clean with no incision or drain  6. Symmetrical Chest wall movement, Good air movement bilaterally, CTAB.  7. RRR, No Gallops, Rubs or Murmurs, No Parasternal Heave.  8. Positive Bowel Sounds, Abdomen Soft, No tenderness, No organomegaly appriciated,No rebound -guarding or rigidity.  9.  No Cyanosis, Normal Skin Turgor, No Skin Rash or Bruise.  10. Good muscle tone,  joints appear normal , no effusions, Normal ROM.    Data Review  CBC  Recent Labs Lab 11/20/15 0622 11/21/15 0455  WBC 10.7* 11.7*  HGB 12.2* 11.0*  HCT 38.4* 34.7*  PLT 309 281  MCV 80.5 80.0  MCH 25.6* 25.3*  MCHC 31.8 31.7  RDW 14.0 14.0    ------------------------------------------------------------------------------------------------------------------  Chemistries   Recent Labs Lab 11/20/15 0622 11/21/15 0455 11/22/15 0537  NA 137 134* 133*  K 3.9 4.4 4.5  CL 102 102 100*  CO2 GLUCOSE 67 143* 145*  BUN 26* 20 22*  CREATININE 1.73* 1.45* 1.50*  CALCIUM 9.3 8.7* 8.8*  AST 14*  --   --   ALT 12*  --   --   ALKPHOS 79  --   --   BILITOT 0.5  --   --    ------------------------------------------------------------------------------------------------------------------ estimated creatinine clearance is 61.9 mL/min (by C-G formula based on Cr of 1.5). ------------------------------------------------------------------------------------------------------------------ No results for input(s): TSH, T4TOTAL, T3FREE, THYROIDAB in the last 72 hours.  Invalid input(s): FREET3   Coagulation profile  Recent Labs Lab 11/20/15 0622  INR 1.13   ------------------------------------------------------------------------------------------------------------------- No results for input(s): DDIMER in the last 72 hours. -------------------------------------------------------------------------------------------------------------------  Cardiac Enzymes No results for input(s): CKMB, TROPONINI, MYOGLOBIN in the last 168 hours.  Invalid input(s): CK ------------------------------------------------------------------------------------------------------------------ Invalid input(s): POCBNP   ---------------------------------------------------------------------------------------------------------------  Urinalysis    Component Value Date/Time   COLORURINE YELLOW 11/20/2015 1428   APPEARANCEUR CLEAR 11/20/2015 1428   LABSPEC 1.015 11/20/2015 1428   PHURINE 5.5 11/20/2015 1428   GLUCOSEU NEGATIVE 11/20/2015 1428   HGBUR NEGATIVE 11/20/2015 1428   BILIRUBINUR NEGATIVE 11/20/2015 1428   KETONESUR NEGATIVE 11/20/2015  1428   PROTEINUR 100* 11/20/2015 1428   NITRITE NEGATIVE 11/20/2015 1428   LEUKOCYTESUR NEGATIVE 11/20/2015 1428     Imaging results:   No results found.    Assessment & Plan  Active Problems:   Left carotid artery stenosis Hypertension Diabetes mellitus History of TIA   Hypertension - Patient with uncontrolled blood pressure, or significantly elevated at home, systolic blood pressure baseline at home 150-180, so we'll aim for gradual lowering of  blood pressure, with current target systolic blood pressure 140-150. - We'll continue home medication including lisinopril and hydrochlorothiazide, will increase hydralazine from 10 mg 4 times daily to 50 mg 4 times daily, and very likely we'll discharge on 50 mg 3 times daily if blood pressure continues to improve(ensure better compliance has unlikely will be taken it 4 times daily), and will add when necessary hydralazine during hospital stay.  Diabetes mellitus - CBG are controlled during hospital stay on insulin sliding scale, as dose Lantus at home, patient reports he has not been compliant with diet at home, and this is very likely the reason is requiring large dose Lantus at home, certainly he'll need to be discharged on much lower dose of Lantus.  History of TIA/left carotid artery stenosis - That is post left carotid endarterectomy - Continue with aspirin, statin and Plavix    AM Labs Ordered, also please review Full Orders  Family Communication: Plan discussed with patient    Thank you for the consult, we will follow the patient with you in the Hospital.   [facility], Brena Windsor M.D on 11/22/2015 at 3:39 PM  Between 7am to 7pm - Pager - [phone]  After 7pm go to [email or web address] - password [email or web address]   Thank you for the consult, we will follow the patient with you in the Hospital.   [facility]   Office  [phone]

## 2015-11-22 NOTE — Progress Notes (Addendum)
  [facility] Progress Note  Subjective  - POD #2  Having headache.   Objective Filed Vitals:   11/22/15 0600 11/22/15 0700  BP: 177/79 139/71  Pulse: 74 74  Temp:    Resp: 23 28    Intake/Output Summary (Last 24 hours) at 11/22/15 0738 Last data filed at 11/22/15 0700  Gross per 24 hour  Intake 3248.46 ml  Output   2375 ml  Net 873.46 ml   Left neck incision clean and intact. Mild fullness around incision.  5/5 strength upper and lower extremities. Tongue midline. Smile symmetric.   Assessment/Planning: 65 y.o. male is s/p: left carotid endarterectomy 2 Days Post-Op   Neuro exam intact.  SBP 130s this am on nitro gtt. Continue to wean off.  D/c home if SBP <180 off nitro gtt. Will check later this afternoon.   Raymond GurneyKimberly A Trinh 11/22/2015 7:38 AM --  Laboratory CBC    Component Value Date/Time   WBC 11.7* 11/21/2015 0455   HGB 11.0* 11/21/2015 0455   HCT 34.7* 11/21/2015 0455   PLT 281 11/21/2015 0455    BMET    Component Value Date/Time   NA 133* 11/22/2015 0537   K 4.5 11/22/2015 0537   CL 100* 11/22/2015 0537   CO2 23 11/22/2015 0537   GLUCOSE 145* 11/22/2015 0537   BUN 22* 11/22/2015 0537   CREATININE 1.50* 11/22/2015 0537   CALCIUM 8.8* 11/22/2015 0537   GFRNONAA 48* 11/22/2015 0537   GFRAA 55* 11/22/2015 0537    COAG Lab Results  Component Value Date   INR 1.13 11/20/2015   INR 1.11 11/02/2015   INR 1.09 10/22/2015   No results found for: PTT  Antibiotics Anti-infectives    Start     Dose/Rate Route Frequency Ordered Stop   11/20/15 2000  cefUROXime (ZINACEF) 1.5 g in dextrose 5 % 50 mL IVPB     1.5 g 100 mL/hr over 30 Minutes Intravenous Every 12 hours 11/20/15 1410 11/21/15 0948   11/20/15 0519  cefUROXime (ZINACEF) 1.5 g in dextrose 5 % 50 mL IVPB     1.5 g 100 mL/hr over 30 Minutes Intravenous 30 min pre-op 11/20/15 0519 11/20/15 0744       Maris BergerKimberly Trinh, PA-C [facility] Office:  [phone] Pager: [phone] 11/22/2015 7:38 AM  Weaning nitroglycerin drip ient's baseline blood pressure in  180 systolic range  PlanDC home today and he [facility] for further adjustment of antihypertensive medications Patient is in agreement with this and wants to be discharged today

## 2015-11-23 DIAGNOSIS — Z720 Tobacco use: Secondary | ICD-10-CM | POA: Diagnosis present

## 2015-11-23 DIAGNOSIS — Z91148 Patient's other noncompliance with medication regimen for other reason: Secondary | ICD-10-CM | POA: Diagnosis present

## 2015-11-23 DIAGNOSIS — IMO0002 Reserved for concepts with insufficient information to code with codable children: Secondary | ICD-10-CM | POA: Diagnosis present

## 2015-11-23 DIAGNOSIS — I10 Essential (primary) hypertension: Secondary | ICD-10-CM | POA: Diagnosis present

## 2015-11-23 DIAGNOSIS — Z9114 Patient's other noncompliance with medication regimen: Secondary | ICD-10-CM | POA: Diagnosis present

## 2015-11-23 DIAGNOSIS — E118 Type 2 diabetes mellitus with unspecified complications: Secondary | ICD-10-CM

## 2015-11-23 DIAGNOSIS — E1165 Type 2 diabetes mellitus with hyperglycemia: Secondary | ICD-10-CM

## 2015-11-23 LAB — GLUCOSE, CAPILLARY
GLUCOSE-CAPILLARY: 198 mg/dL — AB (ref 65–99)
Glucose-Capillary: 159 mg/dL — ABNORMAL HIGH (ref 65–99)
Glucose-Capillary: 208 mg/dL — ABNORMAL HIGH (ref 65–99)

## 2015-11-23 MED ORDER — ISOSORBIDE MONONITRATE ER 30 MG PO TB24
30.0000 mg | ORAL_TABLET | Freq: Every day | ORAL | Status: DC
  Administered 2015-11-23: 30 mg via ORAL
  Filled 2015-11-23: qty 1

## 2015-11-23 MED ORDER — HYDRALAZINE HCL 50 MG PO TABS: 50.0000 mg | ORAL_TABLET | Freq: Three times a day (TID) | ORAL | Status: DC

## 2015-11-23 MED ORDER — HYDRALAZINE HCL 50 MG PO TABS: 75.0000 mg | ORAL_TABLET | Freq: Four times a day (QID) | ORAL | Status: DC

## 2015-11-23 MED ORDER — ISOSORBIDE MONONITRATE ER 30 MG PO TB24: 30.0000 mg | ORAL_TABLET | Freq: Every day | ORAL | Status: AC

## 2015-11-23 MED ORDER — HYDRALAZINE HCL 50 MG PO TABS: 50.0000 mg | ORAL_TABLET | Freq: Four times a day (QID) | ORAL | Status: DC

## 2015-11-23 MED ORDER — HYDRALAZINE HCL 25 MG PO TABS: 75.0000 mg | ORAL_TABLET | Freq: Four times a day (QID) | ORAL | Status: AC

## 2015-11-23 NOTE — Consult Note (Signed)
Medical Consultation   Lance Duncan  ZOX:[id number]  DOB: [birth year]  DOA: 11/20/2015  [facility]   Requesting physician: Chuck Hint, MD, On 11/22/2015   Reason for consultation: Uncontrolled BP   History of Present Illness: Lance Duncan is an 65 y.o. BM PMHx  of hypertension, diabetes mellitus, diet noncompliance, was admitted in April 2017 for left-sided weakness, found to be secondary to TIA, workup was significant for left carotid stenosis, patient admitted on 11/20/2014 for left patchy endarterectomy, postoperatively hospital course was complicated for control blood pressure, initially required nitroglycerin GTT,, discontinued this a.m., blood pressure remains uncontrolled, with systolic readings more than 200s, hospitalist was called to admit, patient has been complaining of occasional headache, but denies any chest pain, denies any shortness of breath.       Review of Systems:  ROS Constitutional:  No weight loss, night sweats, Fevers, chills, fatigue.  HEENT:  No headaches, Difficulty swallowing,Tooth/dental problems,Sore throat,  No sneezing, itching, ear ache, nasal congestion, post nasal drip,  Cardio-vascular:  No chest pain, Orthopnea, PND, swelling in lower extremities, anasarca, dizziness, palpitations  GI:  No heartburn, indigestion, abdominal pain, nausea, vomiting, diarrhea, change in bowel habits, loss of appetite  Resp:  No shortness of breath with exertion or at rest. No excess mucus, no productive cough, No non-productive cough, No coughing up of blood.No change in color of mucus.No wheezing.No chest wall deformity  Skin:  no rash or lesions.  GU:  no dysuria, change in color of urine, no urgency or frequency. No flank pain.  Musculoskeletal:  No joint pain or swelling. No decreased range of motion. No back pain.  Psych:  No change in mood or affect. No depression or anxiety. No memory loss.     Past  Medical History: Past Medical History  Diagnosis Date  . Hypertension   . Diverticula of small intestine   . Type II diabetes mellitus (HCC)   . Hepatitis C     "I'm cured now; tx ended 01/2015" (10/22/2015)  . Stroke (HCC) 10/22/2015    "left side is weaker now" (10/22/2015)  . Arthritis     "mild; hips" (10/22/2015)  . Chronic lower back pain   . Pneumonia     as a teenager  . Kidney stones   . Prostate enlargement   . Lactose intolerance in adult     Past Surgical History: Past Surgical History  Procedure Laterality Date  . Penile prosthesis implant    . Testicle removal Right     "benign"  . Colonoscopy  1990s  . Tear duct probing Bilateral   . Variococele repair  ~ 1975    "tied up varicose veins in one of my groins"  . Eye surgery      10-12 years ago laser surgery for tear ducts  . Endarterectomy Left 11/20/2015    Procedure: ENDARTERECTOMY LEFT CAROTID;  Surgeon: Chuck Hint, MD;  Location: [facility] OR;  Service: Vascular;  Laterality: Left;  . Patch angioplasty Left 11/20/2015    Procedure: PATCH ANGIOPLASTY LEFT CAROTID ARTERY USING Livia Snellen BIOLOGIC PATCH;  Surgeon: Chuck Hint, MD;  Location: [facility] OR;  Service: Vascular;  Laterality: Left;     Allergies:  No Known Allergies   Social History:  reports that he has been smoking Cigarettes.  He has a 23 pack-year smoking history. He has never used smokeless tobacco. He reports that he drinks  alcohol. He reports that he does not use illicit drugs.   Family History: Family History  Problem Relation Age of Onset  . Stroke Mother   . Stroke Father   . Stroke Sister   . Stroke Brother    Acceptable: Family history reviewed and not pertinent (If you reviewed it)   Physical Exam: Filed Vitals:   11/23/15 1358 11/23/15 1437 11/23/15 1500 11/23/15 1553  BP: 149/70 157/62  160/64  Pulse:    54  Temp:   97.7 F (36.5 C) 98.5 F (36.9 C)  TempSrc:   Oral Oral  Resp: 25 14  30   Height:      Weight:       SpO2:    95%    General: A/O 4, NAD, sitting in chair comfortably No acute respiratory distress Eyes: negative scleral hemorrhage, negative anisocoria, negative icterus ENT: Negative Runny nose, negative gingival bleeding, Neck:  Negative scars, masses, torticollis, lymphadenopathy, JVD Lungs: Clear to auscultation bilaterally without wheezes or crackles Cardiovascular: Regular rate and rhythm without murmur gallop or rub normal S1 and S2 Abdomen: negative abdominal pain, nondistended, positive soft, bowel sounds, no rebound, no ascites, no appreciable mass Extremities: No significant cyanosis, clubbing, or edema bilateral lower extremities Skin: Negative rashes, lesions, ulcers Psychiatric:  Negative depression, negative anxiety, negative fatigue, negative mania  Central nervous system:  Cranial nerves II through XII intact, tongue/uvula midline, all extremities muscle strength 5/5, sensation intact throughout, negative dysarthria, negative expressive aphasia, negative receptive aphasia.   Data reviewed:  I have personally reviewed following labs and imaging studies Labs:  CBC:  Recent Labs Lab 11/20/15 0622 11/21/15 0455  WBC 10.7* 11.7*  HGB 12.2* 11.0*  HCT 38.4* 34.7*  MCV 80.5 80.0  PLT 309 281    Basic Metabolic Panel:  Recent Labs Lab 11/20/15 0622 11/21/15 0455 11/22/15 0537  NA 137 134* 133*  K 3.9 4.4 4.5  CL 102 102 100*  CO2 22 22 23   GLUCOSE 67 143* 145*  BUN 26* 20 22*  CREATININE 1.73* 1.45* 1.50*  CALCIUM 9.3 8.7* 8.8*   GFR Estimated Creatinine Clearance: 61.9 mL/min (by C-G formula based on Cr of 1.5). Liver Function Tests:  Recent Labs Lab 11/20/15 0622  AST 14*  ALT 12*  ALKPHOS 79  BILITOT 0.5  PROT 7.5  ALBUMIN 3.4*   No results for input(s): LIPASE, AMYLASE in the last 168 hours. No results for input(s): AMMONIA in the last 168 hours. Coagulation profile  Recent Labs Lab 11/20/15 0622  INR 1.13    Cardiac  Enzymes: No results for input(s): CKTOTAL, CKMB, CKMBINDEX, TROPONINI in the last 168 hours. BNP: Invalid input(s): POCBNP CBG:  Recent Labs Lab 11/22/15 1703 11/22/15 2117 11/23/15 0738 11/23/15 1202 11/23/15 1434  GLUCAP 205* 150* 159* 198* 208*   D-Dimer No results for input(s): DDIMER in the last 72 hours. Hgb A1c No results for input(s): HGBA1C in the last 72 hours. Lipid Profile No results for input(s): CHOL, HDL, LDLCALC, TRIG, CHOLHDL, LDLDIRECT in the last 72 hours. Thyroid function studies No results for input(s): TSH, T4TOTAL, T3FREE, THYROIDAB in the last 72 hours.  Invalid input(s): FREET3 Anemia work up No results for input(s): VITAMINB12, FOLATE, FERRITIN, TIBC, IRON, RETICCTPCT in the last 72 hours. Urinalysis    Component Value Date/Time   COLORURINE YELLOW 11/20/2015 1428   APPEARANCEUR CLEAR 11/20/2015 1428   LABSPEC 1.015 11/20/2015 1428   PHURINE 5.5 11/20/2015 1428   GLUCOSEU NEGATIVE 11/20/2015 1428   HGBUR  NEGATIVE 11/20/2015 1428   BILIRUBINUR NEGATIVE 11/20/2015 1428   KETONESUR NEGATIVE 11/20/2015 1428   PROTEINUR 100* 11/20/2015 1428   NITRITE NEGATIVE 11/20/2015 1428   LEUKOCYTESUR NEGATIVE 11/20/2015 1428     Microbiology No results found for this or any previous visit (from the past 240 hour(s)).     Inpatient Medications:   Scheduled Meds: . aspirin  81 mg Oral Daily  . atorvastatin  40 mg Oral q1800  . clopidogrel  75 mg Oral Daily  . docusate sodium  100 mg Oral Daily  . dorzolamide-timolol  1 drop Both Eyes Daily  . hydrALAZINE  75 mg Oral Q6H  . hydrochlorothiazide  25 mg Oral Daily  . insulin aspart  0-15 Units Subcutaneous TID WC  . isosorbide mononitrate  30 mg Oral Daily  . latanoprost  1 drop Both Eyes QHS  . lisinopril  40 mg Oral Daily  . nicotine  14 mg Transdermal Daily  . pantoprazole  40 mg Oral Daily   Continuous Infusions: . sodium chloride Stopped (11/22/15 0935)  . nitroGLYCERIN Stopped (11/22/15  0935)     Radiological Exams on Admission: No results found.  Impression/Recommendations Active Problems:   Left carotid artery stenosis   Malignant hypertension   Uncontrolled type 2 diabetes mellitus with complication (HCC)   Tobacco abuse   Noncompliance with medication regimen   Hypertension - Patient BP much more stable. Counseled patient that his BP still was not at [facility] guidelines and that he would need to follow-up with his PCP to slowly titrate medications to effect.  -Hydralazine 75 mg QID -HCTZ 25 mg daily -Imdur 30 mg daily -Lisinopril 40 mg daily  Diabetes mellitus type II uncontrolled with complication -4/18 hemoglobin A1c= 8.3 - CBG are controlled during hospital stay on insulin sliding scale.  -Not been compliant with diet at home, and this is very likely the reason is requiring large dose Lantus at home,  - If patient is going to be compliant with his diet would recommend restart Lantus at 66 units daily. Obtain FSBS prior to each meal and prior to bedtime, record in logbook. In addition record all food and drink consumed during the day in his log book. Take log book to PCP in order to allow easier adjustments of medication.   History of TIA/left carotid artery stenosis - S/P left carotid endarterectomy - Aspirin 81 mg daily  -Lipitor 40 mg daily  -Plavix 75 mg daily   Tobacco abuse -Recommend continue NICU down patch 14 mg  From a medical standpoint patient is stable for discharge today. Thank you for this consultation.  Our [facility] hospitalist team will sign off..   Time Spent: 35 minutes  Eugina Row, Roselind Messier M.D. [facility] 11/23/2015, 4:09 PM

## 2015-11-23 NOTE — Progress Notes (Signed)
Dr. Joseph ArtWoods updated prescriptions changing hydralazine and adding imdur. Spoke with Addison LankKim Trihn, PA she is not in hospital to reprint and sign [facility]. Will [facility]-scribe weekend supply [facility] [employer] per patient and sign and have case manager Britta MccreedyBarbara fax Monday am.   IV D/cd, monitor off and CCMD notified. Pt and wife given d/c instructions and medications reviewed. Verbalized understanding with teachback. Pt taken out via w/c with all belongings.

## 2015-11-23 NOTE — Progress Notes (Addendum)
  [facility] Progress Note  VASCULAR SURGERY ASSESSMENT AND PLAN:   The patient is doing well status post left carotid endarterectomy. The only issue has been his blood pressure. Plan discharge today if okay with medical service. He will need close follow up [facility].   VASCULAR QUALITY INITIATIVE: He is on aspirin and is on a statin.   Lance Ferrarihristopher Dickson, MD, FACS Beeper [phone] Office: [phone]   Subjective  - POD #3  No complaints  Objective Filed Vitals:   11/23/15 0630 11/23/15 0721  BP: 172/65 181/70  Pulse:    Temp:  97.9 F (36.6 C)  Resp: 25 26    Intake/Output Summary (Last 24 hours) at 11/23/15 [id number] Last data filed at 11/23/15 [id number]  Gross per 24 hour  Intake 1006.25 ml  Output   2725 ml  Net -1718.75 ml    Left neck incision no hematoma Neuro exam intact  Assessment/Planning: 65 y.o. male is s/p: left carotid endarterectomy 3 Days Post-Op   S/p CEA: Neuro exam intact. Neck without hematoma. Hypertension: BP at baseline this am in 180s, was 209/84 at 0300. Appreciate hospitalist service assistance.  Needs to ambulate this am.  Dispo: If BP remains stable and okay per hospitalist service, will d/c. [facility].   Lance Duncan 11/23/2015 7:28 AM --  Laboratory CBC    Component Value Date/Time   WBC 11.7* 11/21/2015 0455   HGB 11.0* 11/21/2015 0455   HCT 34.7* 11/21/2015 0455   PLT 281 11/21/2015 0455    BMET    Component Value Date/Time   NA 133* 11/22/2015 0537   K 4.5 11/22/2015 0537   CL 100* 11/22/2015 0537   CO2 23 11/22/2015 0537   GLUCOSE 145* 11/22/2015 0537   BUN 22* 11/22/2015 0537   CREATININE 1.50* 11/22/2015 0537   CALCIUM 8.8* 11/22/2015 0537   GFRNONAA 48* 11/22/2015 0537   GFRAA 55* 11/22/2015 0537    COAG Lab Results  Component Value Date   INR 1.13 11/20/2015   INR 1.11 11/02/2015   INR 1.09 10/22/2015   No results found for:  PTT  Antibiotics Anti-infectives    Start     Dose/Rate Route Frequency Ordered Stop   11/20/15 2000  cefUROXime (ZINACEF) 1.5 g in dextrose 5 % 50 mL IVPB     1.5 g 100 mL/hr over 30 Minutes Intravenous Every 12 hours 11/20/15 1410 11/21/15 0948   11/20/15 0519  cefUROXime (ZINACEF) 1.5 g in dextrose 5 % 50 mL IVPB     1.5 g 100 mL/hr over 30 Minutes Intravenous 30 min pre-op 11/20/15 0519 11/20/15 0744       Lance BergerKimberly Trinh, Lance Duncan [facility] Office: [phone] Pager: [phone] 11/23/2015 7:28 AM  I have interviewed the patient and examined the patient. I agree with the findings by the PA.  Cari Carawayhris Dickson, MD [phone]

## 2015-11-23 NOTE — Care Management Note (Addendum)
Case Management Note  Patient Details  Name: Lance Duncan MRN: [id number] Date of Birth: Jan 25, 1951  Subjective/Objective:     Patient is s/p CEA, for dc today, NCM will fax dc summary and script for ultram and hydralazine 50mg  [facility] [place] [phone].                Action/Plan:   Expected Discharge Date:                  Expected Discharge Plan:  Home/Self Care  In-House Referral:     Discharge planning Services  CM Consult  Post Acute Care Choice:    Choice offered to:     DME Arranged:    DME Agency:     HH Arranged:    HH Agency:     Status of Service:  Completed, signed off  Medicare Important Message Given:    Date Medicare IM Given:    Medicare IM give by:    Date Additional Medicare IM Given:    Additional Medicare Important Message give by:     If discussed at Long Length of Stay Meetings, dates discussed:    Additional Comments:  Leone Havenaylor, Sumiko Ceasar Clinton, RN 11/23/2015, 12:05 PM

## 2015-11-23 NOTE — Discharge Summary (Signed)
[facility] Discharge Summary  Lance Duncan 1951-03-14 65 y.o. male  [id number]  Admission Date: 11/20/2015  Discharge Date: 11/23/2015  Physician: Chuck Hint, MD  Admission Diagnosis: Left carotid artery stenosis I65.22  HPI:   This is a 65 y.o. male who presented with left upper extremity and subsequently left lower extremity weakness. His symptoms resolved. He denies any previous history of stroke, TIAs, expressive or receptive aphasia or amaurosis fugax. He was not on aspirin at that time. He is now currently on a baby aspirin and plavix. His cerebral angiogram was reviewed with Dr. Corliss Skains. He had a smooth 50% right carotid stenosis and 80% left carotid stenosis. His CT angiogram of the neck suggested a tighter stenosis on the left and possible string sign that was not confirmed by cerebral arteriography.   Given that the 50% right carotid stenosis was fairly smooth and did not appear ulcerated, it was not not clear whether this was responsible for his right hemispheric TIA. His left carotid was 80% stenotic and it was felt he should be considered for elective repair. He underwent cardiac clearance prior to surgery.   Hospital Course:  The patient was admitted to the hospital and taken to the operating room on 11/20/2015 and underwent left carotid endarterectomy.  The patient tolerated the procedure well and was transported to the PACU in stable condition.  By POD 1, the patient's neuro status was intact. His incision had some mild fullness but no hematoma. He required a nitroglycerin drip for hypertension. His home lisinopril and HCTZ were not ordered initially due to elevated creatinine on day of surgery. His lisinopril and HCTZ were reordered. His creatinine had improved to 1.43 (down from 1.73). He was unable to be weaned off of the nitro drip. Orders were made to keep his SBP below 180 as his baseline was in the 180s.   The remainder of his  hospital stay consisted of blood pressure management. After coming off of the nitrogylcerin drip, his blood pressure remained in the 200s and up to 225 systolic. The hospitalist service was consulted for assistance with blood pressure. His hydralazine was increased to 50 mg q 6 hrs. On POD 3, the patient's blood pressure was stable in the 140s-180s. His hydralazine was increased to 75 mg QID and imdur 30 mg was added. He was neurologically intact. He was discharged home on POD 3 in good condition.     Recent Labs  11/21/15 0455 11/22/15 0537  NA 134* 133*  K 4.4 4.5  CL 102 100*  CO2 22 23  GLUCOSE 143* 145*  BUN 20 22*  CALCIUM 8.7* 8.8*    Recent Labs  11/21/15 0455  WBC 11.7*  HGB 11.0*  HCT 34.7*  PLT 281   No results for input(s): INR in the last 72 hours.  Discharge Instructions:   The patient is discharged to home with extensive instructions on wound care and progressive ambulation.  They are instructed not to drive or perform any heavy lifting until returning to see the physician in his office.      Discharge Instructions    CAROTID Sugery: Call MD for difficulty swallowing or speaking; weakness in arms or legs that is a new symtom; severe headache.  If you have increased swelling in the neck and/or  are having difficulty breathing, CALL [phone]    Complete by:  As directed      Call MD for:  redness, tenderness, or signs of infection (pain, swelling, bleeding,  redness, odor or green/yellow discharge around incision site)    Complete by:  As directed      Call MD for:  severe or increased pain, loss or decreased feeling  in affected limb(s)    Complete by:  As directed      Call MD for:  temperature >100.5    Complete by:  As directed      Discharge wound care:    Complete by:  As directed   Wash wound daily with soap and water and pat dry. Do not apply any creams or ointments on your incisions.     Driving Restrictions    Complete by:  As directed   No driving for 2  weeks     Increase activity slowly    Complete by:  As directed   Walk with assistance use walker or cane as needed     Lifting restrictions    Complete by:  As directed   No heavy lifting for 2 weeks     Resume previous diet    Complete by:  As directed            Discharge Diagnosis:  Left carotid artery stenosis I65.22  Secondary Diagnosis: Patient Active Problem List   Diagnosis Date Noted  . Malignant hypertension   . Uncontrolled type 2 diabetes mellitus with complication (HCC)   . Tobacco abuse   . Noncompliance with medication regimen   . Left carotid artery stenosis 11/20/2015  . TIA (transient ischemic attack) 10/23/2015  . Weakness 10/22/2015  . Stroke [facility](HCC) 10/22/2015   Past Medical History  Diagnosis Date  . Hypertension   . Diverticula of small intestine   . Type II diabetes mellitus (HCC)   . Hepatitis C     "I'm cured now; tx ended 01/2015" (10/22/2015)  . Stroke (HCC) 10/22/2015    "left side is weaker now" (10/22/2015)  . Arthritis     "mild; hips" (10/22/2015)  . Chronic lower back pain   . Pneumonia     as a teenager  . Kidney stones   . Prostate enlargement   . Lactose intolerance in adult       Medication List    TAKE these medications        aspirin 81 MG chewable tablet  Chew 1 tablet (81 mg total) by mouth daily.     atorvastatin 40 MG tablet  Commonly known as:  LIPITOR  Take 1 tablet (40 mg total) by mouth daily at 6 PM.     clopidogrel 75 MG tablet  Commonly known as:  PLAVIX  Take 1 tablet (75 mg total) by mouth daily.     dorzolamide-timolol 22.3-6.8 MG/ML ophthalmic solution  Commonly known as:  COSOPT  Place 1 drop into both eyes daily.     hydrALAZINE 25 MG tablet  Commonly known as:  APRESOLINE  Take 3 tablets (75 mg total) by mouth every 6 (six) hours.     hydrochlorothiazide 25 MG tablet  Commonly known as:  HYDRODIURIL  Take 25 mg by mouth daily.     insulin glargine 100 unit/mL Sopn  Commonly known as:   LANTUS  Inject 66-74 Units into the skin 2 (two) times daily. Inject 66 units subcutaneously every morning (before 1st meal) and 74 units at bedtime     isosorbide mononitrate 30 MG 24 hr tablet  Commonly known as:  IMDUR  Take 1 tablet (30 mg total) by mouth daily.     latanoprost 0.005 %  ophthalmic solution  Commonly known as:  XALATAN  Place 1 drop into both eyes at bedtime.     lisinopril 40 MG tablet  Commonly known as:  PRINIVIL,ZESTRIL  Take 40 mg by mouth daily.     nicotine 14 mg/24hr patch  Commonly known as:  NICODERM CQ - dosed in mg/24 hours  Place 1 patch (14 mg total) onto the skin daily.     nicotine polacrilex 2 MG gum  Commonly known as:  NICORETTE  Take 1 each (2 mg total) by mouth every 2 (two) hours as needed for smoking cessation.     traMADol 50 MG tablet  Commonly known as:  ULTRAM  Take 1 tablet (50 mg total) by mouth 4 (four) times daily. scheduled       Tramadol #15 No Refill  Disposition: Home  Patient's condition: is Good  Follow up: 1. Dr.  Edilia Bo in 2 weeks. 2. PCP on 11/26/15 for hospital f/u and BP management.    Maris Berger, PA-C [facility] [phone]  --- For [facility] use --- Instructions: Press F2 to tab through selections.  Delete question if not applicable.   Modified Rankin score at D/C (0-6): 0  IV medication needed for:  1. Hypertension: Yes 2. Hypotension: No  Post-op Complications: No  1. Post-op CVA or TIA: No  2. CN injury: No  3. Myocardial infarction: No  4.  CHF: No  5.  Dysrhythmia (new): No  6. Wound infection: No  7. Reperfusion symptoms: No  8. Return to OR: No  Discharge medications: Statin use:  Yes If No: [ ]  For Medical reasons, [ ]  Non-compliant, [ ]  Not-indicated ASA use:  Yes  If No: [ ]  For Medical reasons, [ ]  Non-compliant, [ ]  Not-indicated Beta blocker use:  No If No: [ ]  For Medical reasons, [ ]  Non-compliant, [ x] Not-indicated ACE-Inhibitor use:   Yes If No: [ ]  For Medical reasons, [ ]  Non-compliant, [ ]  Not-indicated P2Y12 Antagonist use: Yes, [x ] Plavix, [ ]  Plasugrel, [ ]  Ticlopinine, [ ]  Ticagrelor, [ ]  Other, [ ]  No for medical reason, [ ]  Non-compliant, [ ]  Not-indicated Anti-coagulant use:  No, [ ]  Warfarin, [ ]  Rivaroxaban, [ ]  Dabigatran, [ ]  Other, [ ]  No for medical reason, [ ]  Non-compliant, [ ]  Not-indicated

## 2015-11-28 ENCOUNTER — Encounter: Payer: Non-veteran care | Admitting: Vascular Surgery

## 2015-11-28 ENCOUNTER — Ambulatory Visit (HOSPITAL_COMMUNITY)
Admission: RE | Admit: 2015-11-28 | Discharge: 2015-11-28 | Disposition: A | Discharge status: Home / Self Care | Payer: Non-veteran care | Source: Ambulatory Visit | Attending: Interventional Radiology | Admitting: Interventional Radiology

## 2015-11-28 DIAGNOSIS — I6320 Cerebral infarction due to unspecified occlusion or stenosis of unspecified precerebral arteries: Secondary | ICD-10-CM

## 2015-12-17 ENCOUNTER — Encounter: Payer: Self-pay | Admitting: Vascular Surgery

## 2015-12-21 ENCOUNTER — Ambulatory Visit (INDEPENDENT_AMBULATORY_CARE_PROVIDER_SITE_OTHER): Payer: Self-pay | Admitting: Vascular Surgery

## 2015-12-21 ENCOUNTER — Encounter: Payer: Self-pay | Admitting: Vascular Surgery

## 2015-12-21 VITALS — BP 125/71 | HR 85 | Temp 98.3°F | Resp 14 | Ht 70.5 in | Wt 219.0 lb

## 2015-12-21 DIAGNOSIS — Z48812 Encounter for surgical aftercare following surgery on the circulatory system: Secondary | ICD-10-CM

## 2015-12-21 NOTE — Progress Notes (Signed)
Patient name: Lance PandyRobert Duncan MRN: [id number] DOB: 1951-05-15 Sex: male  REASON FOR VISIT: Follow up after left carotid endarterectomy  HPI: Lance PandyRobert Duncan is a 65 y.o. male who I had seen in consultation on 10/23/2015. He had left upper extremity and left lower extremity weakness. However his cerebral arteriogram showed a smooth 50% right carotid stenosis with an 80% left carotid stenosis. CT angiogram suggested a very tight stenosis on the left and also duplex scan confirmed very high velocities. Therefore I recommended left carotid endarterectomy although this stenosis was likely asymptomatic.  He underwent left carotid endarterectomy with bovine pericardial patch angioplasty on 11/20/2015. His bifurcation was low. The plaque extended fairly high up the internal carotid artery. The stenosis was greater than 90%. He was discharged on postoperative day #1.  He denies any focal weakness or paresthesias. He has had some problem with dizziness which she's had for a long time. He tells me that his blood pressure has been under better control.  He is on aspirin and is on a statin.  Current Outpatient Prescriptions  Medication Sig Dispense Refill  . aspirin 81 MG chewable tablet Chew 1 tablet (81 mg total) by mouth daily. 30 tablet 0  . atorvastatin (LIPITOR) 40 MG tablet Take 1 tablet (40 mg total) by mouth daily at 6 PM. (Patient taking differently: Take 40 mg by mouth daily. ) 30 tablet 0  . clopidogrel (PLAVIX) 75 MG tablet Take 1 tablet (75 mg total) by mouth daily. 30 tablet 0  . dorzolamide-timolol (COSOPT) 22.3-6.8 MG/ML ophthalmic solution Place 1 drop into both eyes daily.     . hydrALAZINE (APRESOLINE) 25 MG tablet Take 3 tablets (75 mg total) by mouth every 6 (six) hours. 36 tablet 0  . hydrochlorothiazide (HYDRODIURIL) 25 MG tablet Take 25 mg by mouth daily.    . insulin glargine (LANTUS) 100 unit/mL SOPN Inject 66-74 Units into the skin 2 (two) times daily. Inject 66 units  subcutaneously every morning (before 1st meal) and 74 units at bedtime    . isosorbide mononitrate (IMDUR) 30 MG 24 hr tablet Take 1 tablet (30 mg total) by mouth daily. 3 tablet 0  . latanoprost (XALATAN) 0.005 % ophthalmic solution Place 1 drop into both eyes at bedtime.    Marland Kitchen. lisinopril (PRINIVIL,ZESTRIL) 40 MG tablet Take 40 mg by mouth daily.    . nicotine (NICODERM CQ - DOSED IN MG/24 HOURS) 14 mg/24hr patch Place 1 patch (14 mg total) onto the skin daily. 21 patch 0  . nicotine polacrilex (NICORETTE) 2 MG gum Take 1 each (2 mg total) by mouth every 2 (two) hours as needed for smoking cessation. 160 tablet 0  . traMADol (ULTRAM) 50 MG tablet Take 1 tablet (50 mg total) by mouth 4 (four) times daily. scheduled 15 tablet 0   No current facility-administered medications for this visit.    REVIEW OF SYSTEMS:  [X]  denotes positive finding, [ ]  denotes negative finding Cardiac  Comments:  Chest pain or chest pressure:    Shortness of breath upon exertion:    Short of breath when lying flat:    Irregular heart rhythm:    Constitutional    Fever or chills:      PHYSICAL EXAM: Filed Vitals:   12/21/15 0843 12/21/15 0845  BP: 127/69 125/71  Pulse: 85 85  Temp: 98.3 F (36.8 C)   Resp: 14   Height: 5' 10.5" (1.791 m)   Weight: 219 lb (99.338 kg)   SpO2: 100%  GENERAL: The patient is a well-nourished male, in no acute distress. The vital signs are documented above. CARDIOVASCULAR: There is a regular rate and rhythm. PULMONARY: There is good air exchange bilaterally without wheezing or rales. His left neck incision is healing nicely. NEURO: He has no focal weakness or paresthesias.   MEDICAL ISSUES:  STATUS POST LEFT CAROTID ENDARTERECTOMY: The patient is doing well status post left carotid endarterectomy. He has no focal weakness or paresthesias. His incision is healing nicely. I have ordered a follow up carotid duplex scan in 6 months for the week and follow the moderate 60%  right carotid stenosis. We have discussed the importance of tobacco cessation. He is on aspirin and is on a statin. I've encouraged him to stay as active as possible. We've also discussed the importance of nutrition. We'll see him back in 6 months. He knows to call sooner if he has problems. With respect to his dizziness and back pain I have encouraged him to follow up with his primary care doctor.  Waverly Ferrari [facility] [place] [phone]

## 2016-02-05 ENCOUNTER — Other Ambulatory Visit: Payer: Self-pay | Admitting: *Deleted

## 2016-02-05 DIAGNOSIS — I6523 Occlusion and stenosis of bilateral carotid arteries: Secondary | ICD-10-CM

## 2016-06-25 ENCOUNTER — Encounter (HOSPITAL_COMMUNITY): Payer: Non-veteran care

## 2016-06-25 ENCOUNTER — Ambulatory Visit: Payer: Non-veteran care | Admitting: Vascular Surgery

## 2016-07-02 ENCOUNTER — Ambulatory Visit: Payer: Non-veteran care | Admitting: Vascular Surgery

## 2016-07-17 ENCOUNTER — Encounter: Payer: Self-pay | Admitting: Vascular Surgery

## 2016-07-17 ENCOUNTER — Telehealth: Payer: Self-pay | Admitting: Vascular Surgery

## 2016-07-17 NOTE — Telephone Encounter (Signed)
Made in error

## 2016-08-08 ENCOUNTER — Encounter: Payer: Self-pay | Admitting: Vascular Surgery

## 2016-08-13 ENCOUNTER — Encounter: Payer: Self-pay | Admitting: Vascular Surgery

## 2016-08-13 ENCOUNTER — Ambulatory Visit (HOSPITAL_COMMUNITY)
Admission: RE | Admit: 2016-08-13 | Discharge: 2016-08-13 | Disposition: A | Discharge status: Home / Self Care | Payer: No Typology Code available for payment source | Source: Ambulatory Visit | Attending: Vascular Surgery | Admitting: Vascular Surgery

## 2016-08-13 ENCOUNTER — Ambulatory Visit (INDEPENDENT_AMBULATORY_CARE_PROVIDER_SITE_OTHER): Payer: No Typology Code available for payment source | Admitting: Vascular Surgery

## 2016-08-13 VITALS — BP 162/84 | HR 57 | Temp 97.3°F | Resp 16 | Ht 70.5 in | Wt 182.0 lb

## 2016-08-13 DIAGNOSIS — I6521 Occlusion and stenosis of right carotid artery: Secondary | ICD-10-CM | POA: Insufficient documentation

## 2016-08-13 DIAGNOSIS — I6523 Occlusion and stenosis of bilateral carotid arteries: Secondary | ICD-10-CM

## 2016-08-13 DIAGNOSIS — Z9889 Other specified postprocedural states: Secondary | ICD-10-CM | POA: Insufficient documentation

## 2016-08-13 DIAGNOSIS — I739 Peripheral vascular disease, unspecified: Secondary | ICD-10-CM | POA: Diagnosis not present

## 2016-08-13 LAB — VAS US CAROTID
LCCADSYS: 43 cm/s
LCCAPDIAS: 15 cm/s
LCCAPSYS: 70 cm/s
LEFT ECA DIAS: -11 cm/s
LEFT VERTEBRAL DIAS: -21 cm/s
LICADSYS: -123 cm/s
LICAPDIAS: -22 cm/s
Left CCA dist dias: 10 cm/s
Left ICA dist dias: -34 cm/s
Left ICA prox sys: -66 cm/s
RCCAPDIAS: 14 cm/s
RCCAPSYS: 77 cm/s
RIGHT CCA MID DIAS: 16 cm/s
RIGHT ECA DIAS: -10 cm/s
RIGHT VERTEBRAL DIAS: -13 cm/s
Right cca dist sys: -100 cm/s

## 2016-08-13 NOTE — Progress Notes (Signed)
Patient name: Lance Duncan MRN: [id number] DOB: [birth year] Sex: male  REASON FOR VISIT: Follow up of carotid disease.  HPI: Lance Duncan is a 66 y.o. male who I saw in consultation in April 2017. He had a smooth 50% right carotid stenosis and a greater than 80% left carotid stenosis. On the left side he had a string sign. He underwent left carotid endarterectomy with bovine pericardial patch angioplasty on 11/20/2015. He comes in for a routine follow up visit.  He denies any history of stroke, TIAs, expressive or receptive aphasia, or amaurosis fugax.  He is on aspirin and is on a statin.  Past Medical History:  Diagnosis Date  . Arthritis    "mild; hips" (10/22/2015)  . Chronic lower back pain   . Diverticula of small intestine   . Hepatitis C    "I'm cured now; tx ended 01/2015" (10/22/2015)  . Hypertension   . Kidney stones   . Lactose intolerance in adult   . Pneumonia    as a teenager  . Prostate enlargement   . Stroke (HCC) 10/22/2015   "left side is weaker now" (10/22/2015)  . Type II diabetes mellitus (HCC)     Family History  Problem Relation Age of Onset  . Stroke Mother   . Stroke Father   . Stroke Sister   . Stroke Brother     SOCIAL HISTORY: Social History  Substance Use Topics  . Smoking status: Current Every Day Smoker    Packs/day: 1.00    Years: 46.00    Types: Cigarettes  . Smokeless tobacco: Never Used     Comment: Back up to 1 pk per day  . Alcohol use No     Comment: 10/22/2015 "I have a drink q 3-4 weeks"    No Known Allergies  Current Outpatient Prescriptions  Medication Sig Dispense Refill  . aspirin 81 MG chewable tablet Chew 1 tablet (81 mg total) by mouth daily. 30 tablet 0  . atorvastatin (LIPITOR) 40 MG tablet Take 1 tablet (40 mg total) by mouth daily at 6 PM. (Patient taking differently: Take 40 mg by mouth daily. ) 30 tablet 0  . cholecalciferol (VITAMIN D) 1000 units tablet Take 1,000 Units by mouth daily.    . clopidogrel  (PLAVIX) 75 MG tablet Take 1 tablet (75 mg total) by mouth daily. 30 tablet 0  . dorzolamide-timolol (COSOPT) 22.3-6.8 MG/ML ophthalmic solution Place 1 drop into both eyes daily.     . hydrALAZINE (APRESOLINE) 25 MG tablet Take 3 tablets (75 mg total) by mouth every 6 (six) hours. 36 tablet 0  . hydrochlorothiazide (HYDRODIURIL) 25 MG tablet Take 25 mg by mouth daily.    . insulin glargine (LANTUS) 100 unit/mL SOPN Inject 66-74 Units into the skin 2 (two) times daily. Inject 66 units subcutaneously every morning (before 1st meal) and 74 units at bedtime    . isosorbide mononitrate (IMDUR) 30 MG 24 hr tablet Take 1 tablet (30 mg total) by mouth daily. 3 tablet 0  . latanoprost (XALATAN) 0.005 % ophthalmic solution Place 1 drop into both eyes at bedtime.    Marland Kitchen lisinopril (PRINIVIL,ZESTRIL) 40 MG tablet Take 40 mg by mouth daily.    . traMADol (ULTRAM) 50 MG tablet Take 1 tablet (50 mg total) by mouth 4 (four) times daily. scheduled 15 tablet 0   No current facility-administered medications for this visit.     REVIEW OF SYSTEMS:  [X]  denotes positive finding, [ ]  denotes negative finding Cardiac  Comments:  Chest pain or chest pressure:    Shortness of breath upon exertion:    Short of breath when lying flat:    Irregular heart rhythm:        Vascular    Pain in calf, thigh, or hip brought on by ambulation:    Pain in feet at night that wakes you up from your sleep:     Blood clot in your veins:    Leg swelling:         Pulmonary    Oxygen at home:    Productive cough:     Wheezing:         Neurologic    Sudden weakness in arms or legs:     Sudden numbness in arms or legs:     Sudden onset of difficulty speaking or slurred speech:    Temporary loss of vision in one eye:     Problems with dizziness:         Gastrointestinal    Blood in stool:     Vomited blood:         Genitourinary    Burning when urinating:     Blood in urine:        Psychiatric    Major depression:           Hematologic    Bleeding problems:    Problems with blood clotting too easily:        Skin    Rashes or ulcers:        Constitutional    Fever or chills:      PHYSICAL EXAM: Vitals:   08/13/16 1555 08/13/16 1557  BP: (!) 162/86 (!) 162/84  Pulse: (!) 57   Resp: 16   Temp: 97.3 F (36.3 C)   TempSrc: Oral   SpO2: 95%   Weight: 182 lb (82.6 kg)   Height: 5' 10.5" (1.791 m)     GENERAL: The patient is a well-nourished male, in no acute distress. The vital signs are documented above. CARDIAC: There is a regular rate and rhythm.  VASCULAR: He has bilateral carotid bruits. He has palpable femoral pulses and a palpable right dorsalis pedis pulse. I cannot palpate pulses in the left foot although both feet are warm and well-perfused. PULMONARY: There is good air exchange bilaterally without wheezing or rales. ABDOMEN: Soft and non-tender with normal pitched bowel sounds.  MUSCULOSKELETAL: There are no major deformities or cyanosis. NEUROLOGIC: No focal weakness or paresthesias are detected. SKIN: There are no ulcers or rashes noted. PSYCHIATRIC: The patient has a normal affect.  DATA:   CAROTID DUPLEX: I have independently interpreted his carotid duplex scan. His left carotid endarterectomy site is widely patent without evidence of restenosis. He has a less than 39% right carotid stenosis. Both vertebral arteries are patent with antegrade flow.  MEDICAL ISSUES:  STATUS POST LEFT CAROTID ENDARTERECTOMY: The patient is doing well status post left carotid endarterectomy. He has no significant stenosis on the right. He is on aspirin and is on a statin. He is not a smoker. I have recommended a follow up visit in 1 year and I will see him at that time. I have ordered a carotid duplex for that time. He knows to call sooner if he has problems.    Waverly Ferrariickson, Keatyn Jawad [facility] [place] [facility] [phone]

## 2016-08-26 NOTE — Addendum Note (Signed)
Addended by: Burton ApleyPETTY, Humphrey Guerreiro A on: 08/26/2016 03:32 PM   Modules accepted: Orders

## 2017-05-07 DEATH — deceased

## 2017-07-23 IMAGING — CT CT HEAD W/O CM
2 series · 15 of 30 positions shown, 17 images · non-contrast
Comparison: 10/22/2015

CLINICAL DATA: Left-sided deficits.

EXAM:
CT HEAD WITHOUT CONTRAST
TECHNIQUE: Contiguous axial images were obtained from the base of the skull
through the vertex without intravenous contrast.

[Series 2: head without · axial · non-contrast · 0.42mm/px · z∈[+1232,+1347]mm · 7 of 31 slices shown, 9 images]
[im 4/31  brain]
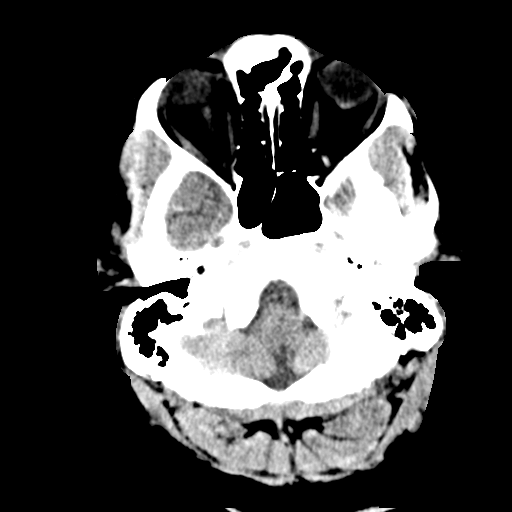
[im 4/31  bone]
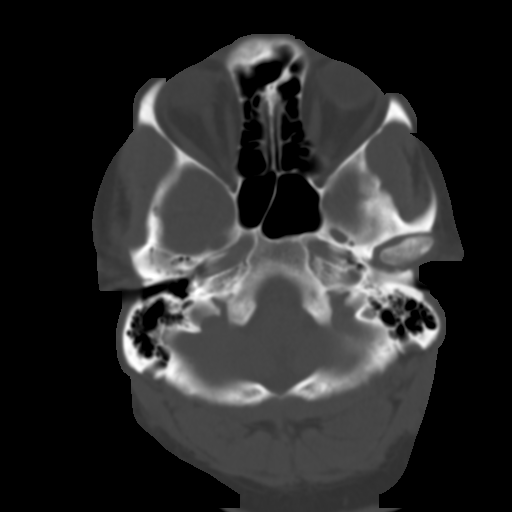
[im 8/31  brain]
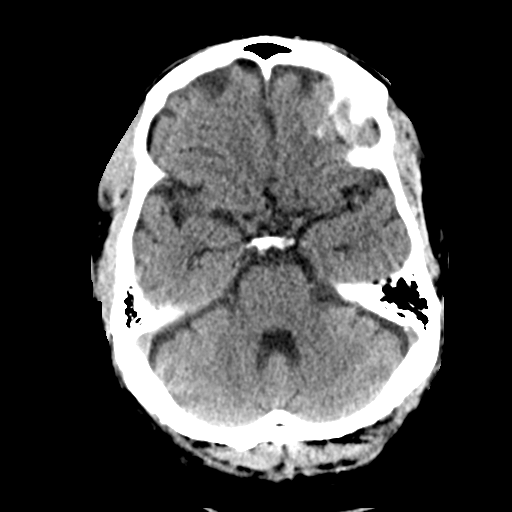
[im 12/31  brain]
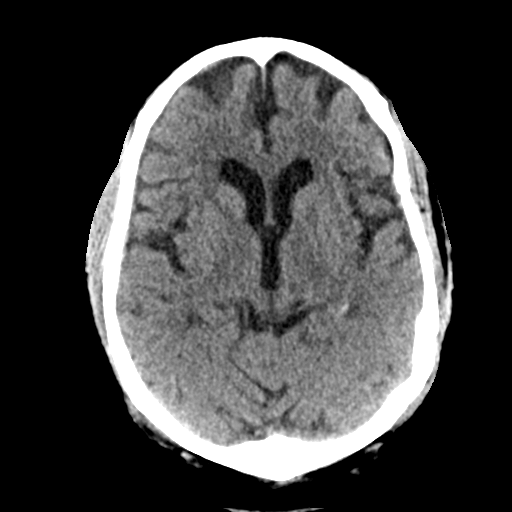
[im 16/31  brain]
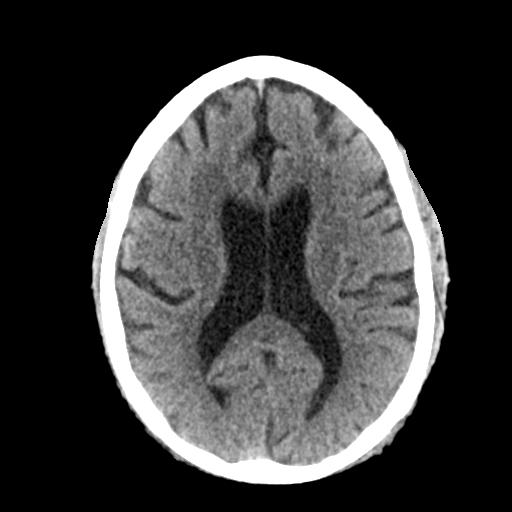
[im 19/31  brain]
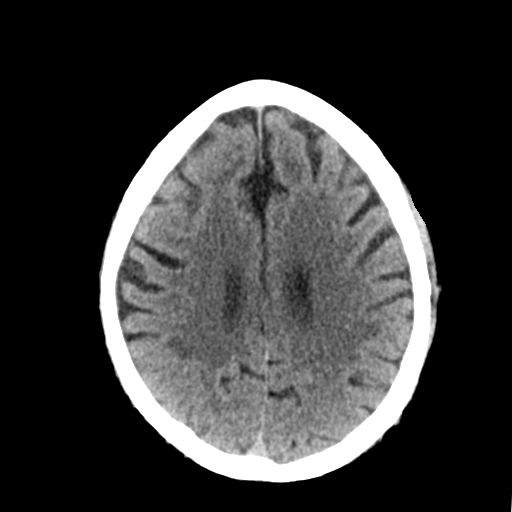
[im 19/31  bone]
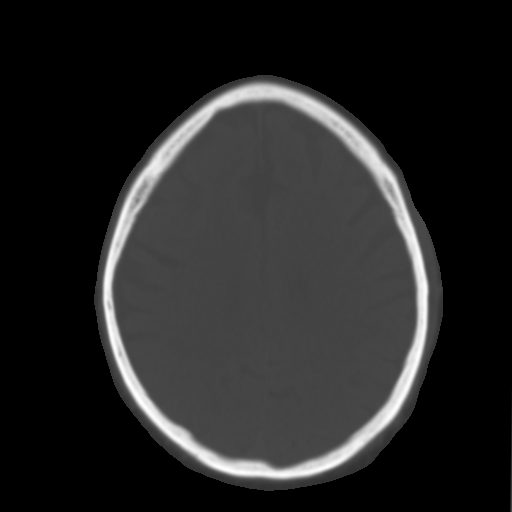
[im 23/31  brain]
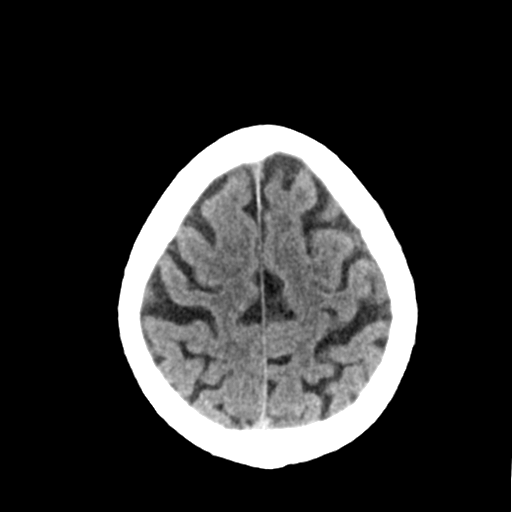
[im 27/31  brain]
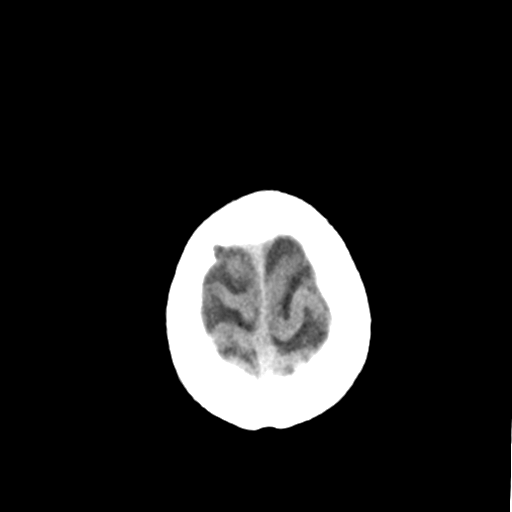

[Series 3: head bone · axial · 0.42mm/px · z∈[+1231,+1355]mm · 8 of 78 slices shown]
[im 8/78  bone]
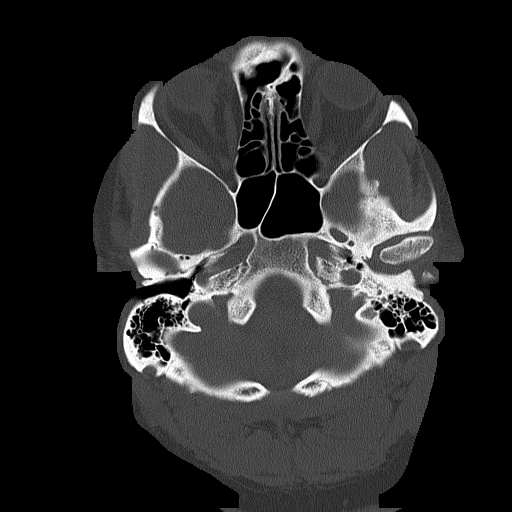
[im 16/78  bone]
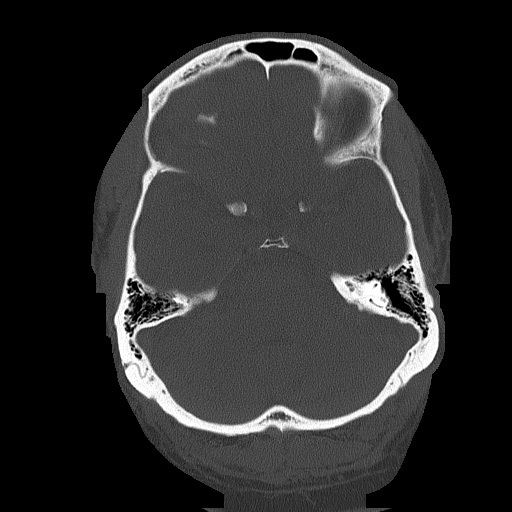
[im 24/78  bone]
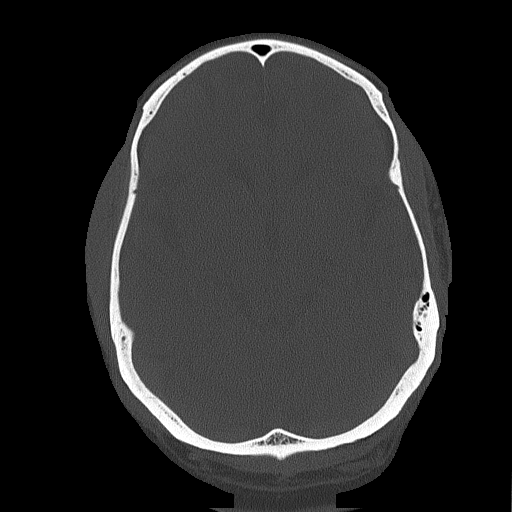
[im 35/78  bone]
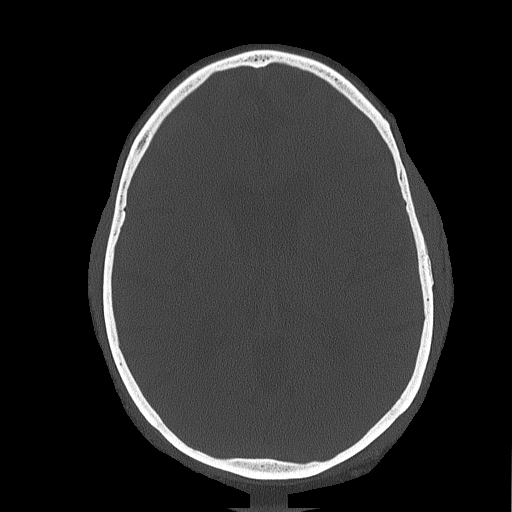
[im 43/78  bone]
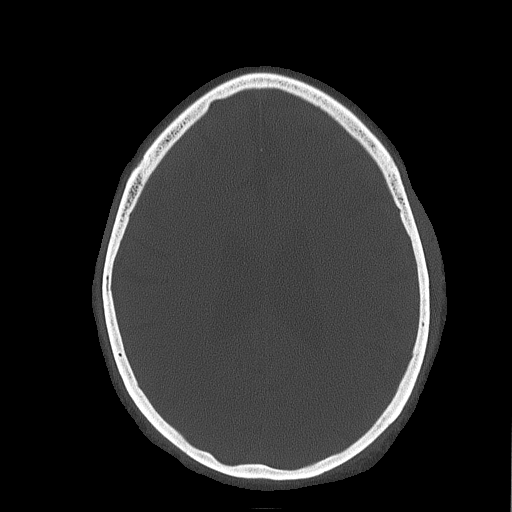
[im 54/78  bone]
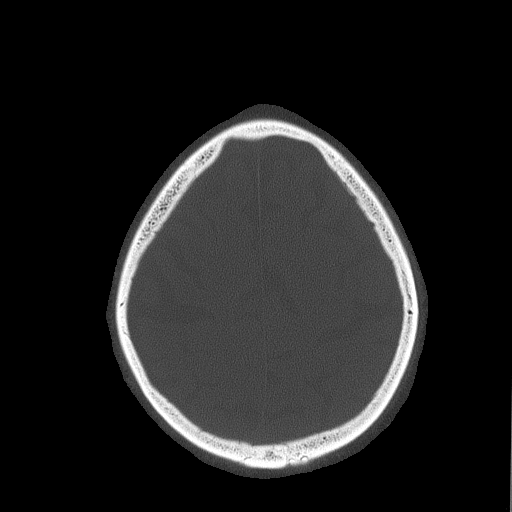
[im 62/78  bone]
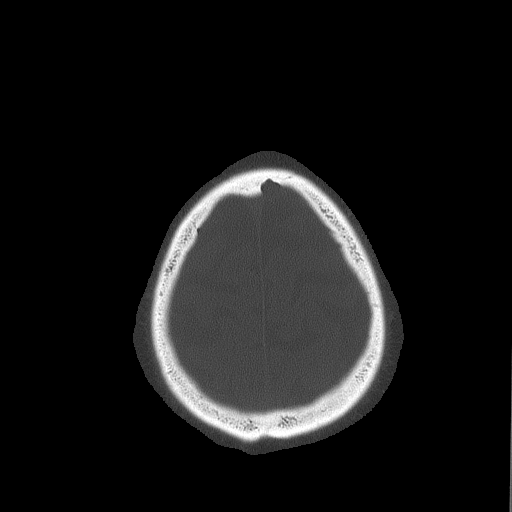
[im 70/78  bone]
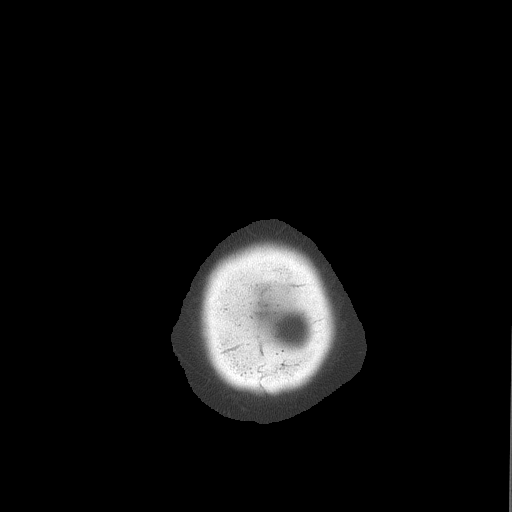

[15 of 30 positions shown; findings below may reference images not displayed]

FINDINGS: Mild cerebral atrophy. No acute intracranial abnormality.
Specifically, no hemorrhage, hydrocephalus, mass lesion, acute
infarction, or significant intracranial injury. No acute calvarial
abnormality. Visualized paranasal sinuses and mastoids clear.
Orbital soft tissues unremarkable.
IMPRESSION: No acute intracranial abnormality.

## 2017-08-11 ENCOUNTER — Telehealth: Payer: Self-pay | Admitting: Vascular Surgery

## 2017-08-12 NOTE — Telephone Encounter (Signed)
ERROR

## 2017-08-19 ENCOUNTER — Encounter (HOSPITAL_COMMUNITY): Payer: Self-pay

## 2017-08-19 ENCOUNTER — Encounter (HOSPITAL_COMMUNITY): Payer: Non-veteran care

## 2017-08-19 ENCOUNTER — Ambulatory Visit: Payer: Non-veteran care

## 2020-11-02 ENCOUNTER — Other Ambulatory Visit (HOSPITAL_BASED_OUTPATIENT_CLINIC_OR_DEPARTMENT_OTHER): Payer: Self-pay
